# Patient Record
Sex: Female | Born: 1950 | Race: White | Hispanic: No | Marital: Married | Smoking: Never smoker
Health system: Southern US, Community
[De-identification: ages and names within clinical notes are randomized; demographics above are authoritative.]

## PROBLEM LIST (undated history)

## (undated) DIAGNOSIS — E1140 Type 2 diabetes mellitus with diabetic neuropathy, unspecified: Secondary | ICD-10-CM

## (undated) DIAGNOSIS — K259 Gastric ulcer, unspecified as acute or chronic, without hemorrhage or perforation: Secondary | ICD-10-CM

## (undated) DIAGNOSIS — E7800 Pure hypercholesterolemia, unspecified: Secondary | ICD-10-CM

## (undated) DIAGNOSIS — E119 Type 2 diabetes mellitus without complications: Secondary | ICD-10-CM

## (undated) DIAGNOSIS — N189 Chronic kidney disease, unspecified: Secondary | ICD-10-CM

## (undated) DIAGNOSIS — M797 Fibromyalgia: Secondary | ICD-10-CM

## (undated) DIAGNOSIS — H4020X0 Unspecified primary angle-closure glaucoma, stage unspecified: Secondary | ICD-10-CM

## (undated) DIAGNOSIS — I10 Essential (primary) hypertension: Secondary | ICD-10-CM

## (undated) DIAGNOSIS — K219 Gastro-esophageal reflux disease without esophagitis: Secondary | ICD-10-CM

## (undated) HISTORY — DX: Gastro-esophageal reflux disease without esophagitis: K21.9

## (undated) HISTORY — PX: CHOLECYSTECTOMY: SHX55

## (undated) HISTORY — PX: ESOPHAGOGASTRODUODENOSCOPY: SHX1529

## (undated) HISTORY — PX: COLONOSCOPY: SHX174

## (undated) HISTORY — PX: APPENDECTOMY: SHX54

## (undated) HISTORY — DX: Gastric ulcer, unspecified as acute or chronic, without hemorrhage or perforation: K25.9

## (undated) HISTORY — PX: REFRACTIVE SURGERY: SHX103

## (undated) HISTORY — DX: Type 2 diabetes mellitus with diabetic neuropathy, unspecified: E11.40

---

## 2013-05-09 ENCOUNTER — Encounter (HOSPITAL_COMMUNITY): Payer: Self-pay | Admitting: Pharmacy Technician

## 2013-05-16 NOTE — Patient Instructions (Signed)
Meredith Leblanc  05/16/2013   Your procedure is scheduled on:  Monday,April 27  Report to [facility] at 0730 AM.  Call this number if you have problems the morning of surgery: [phone]   Remember:   Do not eat food or drink liquids after midnight.   Take these medicines the morning of surgery with A SIP OF WATER: Lisinopril   Do not wear jewelry, make-up or nail polish.  Do not wear lotions, powders, or perfumes. You may wear deodorant.  Do not shave 48 hours prior to surgery. Men may shave face and neck.  Do not bring valuables to the hospital.  [facility] is not responsible  for any belongings or valuables.               Contacts, dentures or bridgework may not be worn into surgery.  Leave suitcase in the car. After surgery it may be brought to your room.  For patients admitted to the hospital, discharge time is determined by your treatment team.               Patients discharged the day of surgery will not be allowed to drive  home.  Name and phone number of your driver: Family or friend  Special Instructions: N/A   Please read over the following fact sheets that you were given: Pain Booklet, Coughing and Deep Breathing, MRSA Information, Surgical Site Infection Prevention, Anesthesia Post-op Instructions and Care and Recovery After Surgery  PATIENT INSTRUCTIONS POST-ANESTHESIA  IMMEDIATELY FOLLOWING SURGERY:  Do not drive or operate machinery for the first twenty four hours after surgery.  Do not make any important decisions for twenty four hours after surgery or while taking narcotic pain medications or sedatives.  If you develop intractable nausea and vomiting or a severe headache please notify your doctor immediately.  FOLLOW-UP:  Please make an appointment with your surgeon as instructed. You do not need to follow up with anesthesia unless specifically instructed to do so.  WOUND CARE INSTRUCTIONS (if applicable):  Keep a dry clean dressing on the anesthesia/puncture  wound site if there is drainage.  Once the wound has quit draining you may leave it open to air.  Generally you should leave the bandage intact for twenty four hours unless there is drainage.  If the epidural site drains for more than 36-48 hours please call the anesthesia department.  QUESTIONS?:  Please feel free to call your physician or the hospital operator if you have any questions, and they will be happy to assist you.     Cataract Surgery  A cataract is a clouding of the lens of the eye. When a lens becomes cloudy, vision is reduced based on the degree and nature of the clouding. Surgery may be needed to improve vision. Surgery removes the cloudy lens and usually replaces it with a substitute lens (intraocular lens, IOL). LET YOUR EYE DOCTOR KNOW ABOUT:  Allergies to food or medicine.  Medicines taken including herbs, eyedrops, over-the-counter medicines, and creams.  Use of steroids (by mouth or creams).  Previous problems with anesthetics or numbing medicine.  History of bleeding problems or blood clots.  Previous surgery.  Other health problems, including diabetes and kidney problems.  Possibility of pregnancy, if this applies. RISKS AND COMPLICATIONS  Infection.  Inflammation of the eyeball (endophthalmitis) that can spread to both eyes (sympathetic ophthalmia).  Poor wound healing.  If an IOL is inserted, it can later fall out of proper position. This is very uncommon.  Clouding of the part of your eye that holds an IOL in place. This is called an "after-cataract." These are uncommon, but easily treated. BEFORE THE PROCEDURE  Do not eat or drink anything except small amounts of water for 8 to 12 before your surgery, or as directed by your caregiver.  Unless you are told otherwise, continue any eyedrops you have been prescribed.  Talk to your primary caregiver about all other medicines that you take (both prescription and non-prescription). In some cases, you may  need to stop or change medicines near the time of your surgery. This is most important if you are taking blood-thinning medicine.Do not stop medicines unless you are told to do so.  Arrange for someone to drive you to and from the procedure.  Do not put contact lenses in either eye on the day of your surgery. PROCEDURE There is more than one method for safely removing a cataract. Your doctor can explain the differences and help determine which is best for you. Phacoemulsification surgery is the most common form of cataract surgery.  An injection is given behind the eye or eyedrops are given to make this a painless procedure.  A small cut (incision) is made on the edge of the clear, dome-shaped surface that covers the front of the eye (cornea).  A tiny probe is painlessly inserted into the eye. This device gives off ultrasound waves that soften and break up the cloudy center of the lens. This makes it easier for the cloudy lens to be removed by suction.  An IOL may be implanted.  The normal lens of the eye is covered by a clear capsule. Part of that capsule is intentionally left in the eye to support the IOL.  Your surgeon may or may not use stitches to close the incision. There are other forms of cataract surgery that require a larger incision and stiches to close the eye. This approach is taken in cases where the doctor feels that the cataract cannot be easily removed using phacoemulsification. AFTER THE PROCEDURE  When an IOL is implanted, it does not need care. It becomes a permanent part of your eye and cannot be seen or felt.  Your doctor will schedule follow-up exams to check on your progress.  Review your other medicines with your doctor to see which can be resumed after surgery.  Use eyedrops or take medicine as prescribed by your doctor.

## 2013-05-19 ENCOUNTER — Encounter (HOSPITAL_COMMUNITY)
Admission: RE | Admit: 2013-05-19 | Discharge: 2013-05-19 | Disposition: A | Discharge status: Home / Self Care | Payer: PRIVATE HEALTH INSURANCE | Source: Ambulatory Visit | Attending: Ophthalmology | Admitting: Ophthalmology

## 2013-05-19 ENCOUNTER — Encounter (HOSPITAL_COMMUNITY): Payer: Self-pay

## 2013-05-19 DIAGNOSIS — Z01810 Encounter for preprocedural cardiovascular examination: Secondary | ICD-10-CM | POA: Insufficient documentation

## 2013-05-19 DIAGNOSIS — Z01812 Encounter for preprocedural laboratory examination: Secondary | ICD-10-CM | POA: Insufficient documentation

## 2013-05-19 HISTORY — DX: Fibromyalgia: M79.7

## 2013-05-19 HISTORY — DX: Type 2 diabetes mellitus without complications: E11.9

## 2013-05-19 HISTORY — DX: Unspecified primary angle-closure glaucoma, stage unspecified: H40.20X0

## 2013-05-19 HISTORY — DX: Chronic kidney disease, unspecified: N18.9

## 2013-05-19 HISTORY — DX: Pure hypercholesterolemia, unspecified: E78.00

## 2013-05-19 LAB — HEMOGLOBIN AND HEMATOCRIT, BLOOD
HEMATOCRIT: 37 % (ref 36.0–46.0)
Hemoglobin: 12.5 g/dL (ref 12.0–15.0)

## 2013-05-19 LAB — BASIC METABOLIC PANEL
BUN: 13 mg/dL (ref 6–23)
CO2: 27 mEq/L (ref 19–32)
Calcium: 10.3 mg/dL (ref 8.4–10.5)
Chloride: 102 mEq/L (ref 96–112)
Creatinine, Ser: 0.58 mg/dL (ref 0.50–1.10)
GFR calc Af Amer: >90 mL/min (ref >90)
GLUCOSE: 104 mg/dL — AB (ref 70–99)
Potassium: 4.4 mEq/L (ref 3.7–5.3)
SODIUM: 142 meq/L (ref 137–147)

## 2013-05-19 NOTE — Pre-Procedure Instructions (Signed)
Patient given information to sign up for my chart at home. 

## 2013-05-23 MED ORDER — CYCLOPENTOLATE-PHENYLEPHRINE OP SOLN OPTIME - NO CHARGE
OPHTHALMIC | Status: AC
  Filled 2013-05-23: qty 2

## 2013-05-23 MED ORDER — TETRACAINE HCL 0.5 % OP SOLN
OPHTHALMIC | Status: AC
  Filled 2013-05-23: qty 2

## 2013-05-23 MED ORDER — NEOMYCIN-POLYMYXIN-DEXAMETH 3.5-10000-0.1 OP SUSP
OPHTHALMIC | Status: AC
  Filled 2013-05-23: qty 5

## 2013-05-23 MED ORDER — PHENYLEPHRINE HCL 2.5 % OP SOLN
OPHTHALMIC | Status: AC
  Filled 2013-05-23: qty 15

## 2013-05-23 MED ORDER — LIDOCAINE HCL 3.5 % OP GEL
OPHTHALMIC | Status: AC
  Filled 2013-05-23: qty 1

## 2013-05-23 MED ORDER — LIDOCAINE HCL (PF) 1 % IJ SOLN
INTRAMUSCULAR | Status: AC
  Filled 2013-05-23: qty 2

## 2013-05-26 ENCOUNTER — Encounter (HOSPITAL_COMMUNITY): Payer: PRIVATE HEALTH INSURANCE | Admitting: Anesthesiology

## 2013-05-26 ENCOUNTER — Ambulatory Visit (HOSPITAL_COMMUNITY): Payer: PRIVATE HEALTH INSURANCE | Admitting: Anesthesiology

## 2013-05-26 ENCOUNTER — Encounter (HOSPITAL_COMMUNITY): Payer: Self-pay

## 2013-05-26 ENCOUNTER — Encounter (HOSPITAL_COMMUNITY)
Admission: RE | Disposition: A | Discharge status: Home / Self Care | Payer: Self-pay | Source: Ambulatory Visit | Attending: Ophthalmology

## 2013-05-26 ENCOUNTER — Ambulatory Visit (HOSPITAL_COMMUNITY)
Admission: RE | Admit: 2013-05-26 | Discharge: 2013-05-26 | Disposition: A | Discharge status: Home / Self Care | Payer: PRIVATE HEALTH INSURANCE | Source: Ambulatory Visit | Attending: Ophthalmology | Admitting: Ophthalmology

## 2013-05-26 DIAGNOSIS — H2589 Other age-related cataract: Secondary | ICD-10-CM | POA: Insufficient documentation

## 2013-05-26 HISTORY — PX: CATARACT EXTRACTION W/PHACO: SHX586

## 2013-05-26 LAB — GLUCOSE, CAPILLARY: GLUCOSE-CAPILLARY: 138 mg/dL — AB (ref 70–99)

## 2013-05-26 SURGERY — PHACOEMULSIFICATION, CATARACT, WITH IOL INSERTION
Anesthesia: Monitor Anesthesia Care | Site: Eye | Laterality: Right

## 2013-05-26 MED ORDER — FENTANYL CITRATE 0.05 MG/ML IJ SOLN: 25.0000 ug | INTRAMUSCULAR | Status: DC | PRN

## 2013-05-26 MED ORDER — EPINEPHRINE HCL 1 MG/ML IJ SOLN
INTRAOCULAR | Status: DC | PRN
  Administered 2013-05-26: 09:00:00

## 2013-05-26 MED ORDER — TETRACAINE HCL 0.5 % OP SOLN
1.0000 [drp] | OPHTHALMIC | Status: AC
  Administered 2013-05-26 (×3): 1 [drp] via OPHTHALMIC

## 2013-05-26 MED ORDER — FENTANYL CITRATE 0.05 MG/ML IJ SOLN
INTRAMUSCULAR | Status: AC
  Filled 2013-05-26: qty 2

## 2013-05-26 MED ORDER — BSS IO SOLN
INTRAOCULAR | Status: DC | PRN
  Administered 2013-05-26: 15 mL via INTRAOCULAR

## 2013-05-26 MED ORDER — POVIDONE-IODINE 5 % OP SOLN
OPHTHALMIC | Status: DC | PRN
  Administered 2013-05-26: 1 via OPHTHALMIC

## 2013-05-26 MED ORDER — NA HYALUR & NA CHOND-NA HYALUR 0.55-0.5 ML IO KIT
PACK | INTRAOCULAR | Status: DC | PRN
  Administered 2013-05-26: 1 via OPHTHALMIC

## 2013-05-26 MED ORDER — CYCLOPENTOLATE-PHENYLEPHRINE 0.2-1 % OP SOLN
1.0000 [drp] | OPHTHALMIC | Status: AC
  Administered 2013-05-26 (×3): 1 [drp] via OPHTHALMIC

## 2013-05-26 MED ORDER — MIDAZOLAM HCL 2 MG/2ML IJ SOLN
1.0000 mg | INTRAMUSCULAR | Status: DC | PRN
  Administered 2013-05-26: 2 mg via INTRAVENOUS

## 2013-05-26 MED ORDER — PHENYLEPHRINE HCL 2.5 % OP SOLN
1.0000 [drp] | OPHTHALMIC | Status: AC
  Administered 2013-05-26 (×3): 1 [drp] via OPHTHALMIC

## 2013-05-26 MED ORDER — NEOMYCIN-POLYMYXIN-DEXAMETH 3.5-10000-0.1 OP SUSP
OPHTHALMIC | Status: DC | PRN
  Administered 2013-05-26: 2 [drp] via OPHTHALMIC

## 2013-05-26 MED ORDER — FENTANYL CITRATE 0.05 MG/ML IJ SOLN
25.0000 ug | INTRAMUSCULAR | Status: AC
  Administered 2013-05-26 (×2): 25 ug via INTRAVENOUS

## 2013-05-26 MED ORDER — LIDOCAINE HCL (PF) 1 % IJ SOLN
INTRAMUSCULAR | Status: DC | PRN
  Administered 2013-05-26: .6 mL

## 2013-05-26 MED ORDER — MIDAZOLAM HCL 2 MG/2ML IJ SOLN
INTRAMUSCULAR | Status: AC
  Filled 2013-05-26: qty 2

## 2013-05-26 MED ORDER — LIDOCAINE 3.5 % OP GEL OPTIME - NO CHARGE
OPHTHALMIC | Status: DC | PRN
  Administered 2013-05-26: 2 [drp] via OPHTHALMIC

## 2013-05-26 MED ORDER — LACTATED RINGERS IV SOLN
INTRAVENOUS | Status: DC
  Administered 2013-05-26: 09:00:00 via INTRAVENOUS

## 2013-05-26 MED ORDER — ONDANSETRON HCL 4 MG/2ML IJ SOLN: 4.0000 mg | Freq: Once | INTRAMUSCULAR | Status: DC | PRN

## 2013-05-26 MED ORDER — LIDOCAINE HCL 3.5 % OP GEL: 1.0000 "application " | Freq: Once | OPHTHALMIC | Status: DC

## 2013-05-26 SURGICAL SUPPLY — 34 items
CAPSULAR TENSION RING-AMO (OPHTHALMIC RELATED) IMPLANT
CLOTH BEACON ORANGE TIMEOUT ST (SAFETY) ×3 IMPLANT
EYE SHIELD UNIVERSAL CLEAR (GAUZE/BANDAGES/DRESSINGS) ×3 IMPLANT
GLOVE BIO SURGEON STRL SZ 6.5 (GLOVE) IMPLANT
GLOVE BIO SURGEONS STRL SZ 6.5 (GLOVE)
GLOVE BIOGEL PI IND STRL 6.5 (GLOVE) IMPLANT
GLOVE BIOGEL PI IND STRL 7.0 (GLOVE) ×1 IMPLANT
GLOVE BIOGEL PI IND STRL 7.5 (GLOVE) IMPLANT
GLOVE BIOGEL PI INDICATOR 6.5 (GLOVE)
GLOVE BIOGEL PI INDICATOR 7.0 (GLOVE) ×2
GLOVE BIOGEL PI INDICATOR 7.5 (GLOVE)
GLOVE ECLIPSE 6.5 STRL STRAW (GLOVE) IMPLANT
GLOVE ECLIPSE 7.0 STRL STRAW (GLOVE) IMPLANT
GLOVE ECLIPSE 7.5 STRL STRAW (GLOVE) IMPLANT
GLOVE EXAM NITRILE LRG STRL (GLOVE) IMPLANT
GLOVE EXAM NITRILE MD LF STRL (GLOVE) IMPLANT
GLOVE SKINSENSE NS SZ6.5 (GLOVE)
GLOVE SKINSENSE NS SZ7.0 (GLOVE)
GLOVE SKINSENSE STRL SZ6.5 (GLOVE) IMPLANT
GLOVE SKINSENSE STRL SZ7.0 (GLOVE) IMPLANT
GLOVE SS N UNI LF 7.0 STRL (GLOVE) ×3 IMPLANT
KIT VITRECTOMY (OPHTHALMIC RELATED) IMPLANT
PAD ARMBOARD 7.5X6 YLW CONV (MISCELLANEOUS) ×3 IMPLANT
PROC W NO LENS (INTRAOCULAR LENS)
PROC W SPEC LENS (INTRAOCULAR LENS)
PROCESS W NO LENS (INTRAOCULAR LENS) IMPLANT
PROCESS W SPEC LENS (INTRAOCULAR LENS) IMPLANT
RING MALYGIN (MISCELLANEOUS) IMPLANT
SIGHTPATH CAT PROC W REG LENS (Ophthalmic Related) ×3 IMPLANT
SYR TB 1ML LL NO SAFETY (SYRINGE) ×3 IMPLANT
TAPE SURG TRANSPORE 1 IN (GAUZE/BANDAGES/DRESSINGS) ×1 IMPLANT
TAPE SURGICAL TRANSPORE 1 IN (GAUZE/BANDAGES/DRESSINGS) ×2
VISCOELASTIC ADDITIONAL (OPHTHALMIC RELATED) IMPLANT
WATER STERILE IRR 250ML POUR (IV SOLUTION) ×3 IMPLANT

## 2013-05-26 NOTE — Discharge Instructions (Signed)

## 2013-05-26 NOTE — Anesthesia Preprocedure Evaluation (Addendum)
Anesthesia Evaluation  Patient identified by MRN, date of birth, ID band Patient awake    Reviewed: Allergy & Precautions, H&P , NPO status , Patient's Chart, lab work & pertinent test results  Airway Mallampati: III TM Distance: >3 FB     Dental  (+) Teeth Intact   Pulmonary  breath sounds clear to auscultation        Cardiovascular negative cardio ROS  Rhythm:Regular     Neuro/Psych    GI/Hepatic   Endo/Other  diabetes, Type 2, Oral Hypoglycemic Agents  Renal/GU Renal InsufficiencyRenal disease     Musculoskeletal  (+) Fibromyalgia -  Abdominal   Peds  Hematology   Anesthesia Other Findings   Reproductive/Obstetrics                          Anesthesia Physical Anesthesia Plan  ASA: III  Anesthesia Plan: MAC   Post-op Pain Management:    Induction: Intravenous  Airway Management Planned: Nasal Cannula  Additional Equipment:   Intra-op Plan:   Post-operative Plan:   Informed Consent: I have reviewed the patients History and Physical, chart, labs and discussed the procedure including the risks, benefits and alternatives for the proposed anesthesia with the patient or authorized representative who has indicated his/her understanding and acceptance.     Plan Discussed with:   Anesthesia Plan Comments:         Anesthesia Quick Evaluation

## 2013-05-26 NOTE — Anesthesia Postprocedure Evaluation (Signed)
  Anesthesia Post-op Note  Patient: Meredith Leblanc  Procedure(s) Performed: Procedure(s) (LRB): CATARACT EXTRACTION PHACO AND INTRAOCULAR LENS PLACEMENT (IOC) (Right)  Patient Location:  Short Stay  Anesthesia Type: MAC  Level of Consciousness: awake  Airway and Oxygen Therapy: Patient Spontanous Breathing  Post-op Pain: none  Post-op Assessment: Post-op Vital signs reviewed, Patient's Cardiovascular Status Stable, Respiratory Function Stable, Patent Airway, No signs of Nausea or vomiting and Pain level controlled  Post-op Vital Signs: Reviewed and stable  Complications: No apparent anesthesia complications

## 2013-05-26 NOTE — Transfer of Care (Signed)
Immediate Anesthesia Transfer of Care Note  Patient: Meredith Leblanc  Procedure(s) Performed: Procedure(s) (LRB): CATARACT EXTRACTION PHACO AND INTRAOCULAR LENS PLACEMENT (IOC) (Right)  Patient Location: Shortstay  Anesthesia Type: MAC  Level of Consciousness: awake  Airway & Oxygen Therapy: Patient Spontanous Breathing   Post-op Assessment: Report given to PACU RN, Post -op Vital signs reviewed and stable and Patient moving all extremities  Post vital signs: Reviewed and stable  Complications: No apparent anesthesia complications

## 2013-05-26 NOTE — Anesthesia Procedure Notes (Signed)
Procedure Name: MAC Date/Time: 05/26/2013 8:55 AM Performed by: Vista Deck Pre-anesthesia Checklist: Patient identified, Emergency Drugs available, Suction available, Timeout performed and Patient being monitored Patient Re-evaluated:Patient Re-evaluated prior to inductionOxygen Delivery Method: Nasal Cannula

## 2013-05-26 NOTE — H&P (Signed)
I have reviewed the H&P, the patient was re-examined, and I have identified no interval changes in medical condition and plan of care since the history and physical of record  

## 2013-05-26 NOTE — Op Note (Signed)
Date of Admission: 05/26/2013  Date of Surgery: 05/26/2013   Pre-Op Dx: Cataract Right Eye  Post-Op Dx: Combined Cataract Right  Eye,  Dx Code 366.19  Surgeon: Tonny Branch, M.D.  Assistants: None  Anesthesia: Topical with MAC  Indications: Painless, progressive loss of vision with compromise of daily activities.  Surgery: Cataract Extraction with Intraocular lens Implant Right Eye  Discription: The patient had dilating drops and viscous lidocaine placed into the Right eye in the pre-op holding area. After transfer to the operating room, a time out was performed. The patient was then prepped and draped. Beginning with a 48 degree blade a paracentesis port was made at the surgeon's 2 o'clock position. The anterior chamber was then filled with 1% non-preserved lidocaine. This was followed by filling the anterior chamber with Provisc.  A 2.40mm keratome blade was used to make a clear corneal incision at the temporal limbus.  A bent cystatome needle was used to create a continuous tear capsulotomy. Hydrodissection was performed with balanced salt solution on a Fine canula. The lens nucleus was then removed using the phacoemulsification handpiece. Residual cortex was removed with the I&A handpiece. The anterior chamber and capsular bag were refilled with Provisc. A posterior chamber intraocular lens was placed into the capsular bag with it's injector. The implant was positioned with the Kuglan hook. The Provisc was then removed from the anterior chamber and capsular bag with the I&A handpiece. Stromal hydration of the main incision and paracentesis port was performed with BSS on a Fine canula. The wounds were tested for leak which was negative. The patient tolerated the procedure well. There were no operative complications. The patient was then transferred to the recovery room in stable condition.  Complications: None  Specimen: None  EBL: None  Prosthetic device: Hoya iSert 250, power 21.0 D, SN  [id number].

## 2013-05-27 ENCOUNTER — Encounter (HOSPITAL_COMMUNITY): Payer: Self-pay | Admitting: Ophthalmology

## 2013-06-02 ENCOUNTER — Encounter (HOSPITAL_COMMUNITY): Payer: Self-pay | Admitting: Pharmacy Technician

## 2013-06-03 ENCOUNTER — Encounter (HOSPITAL_COMMUNITY): Payer: Self-pay

## 2013-06-03 ENCOUNTER — Encounter (HOSPITAL_COMMUNITY)
Admission: RE | Admit: 2013-06-03 | Discharge: 2013-06-03 | Disposition: A | Discharge status: Home / Self Care | Payer: PRIVATE HEALTH INSURANCE | Source: Ambulatory Visit | Attending: Ophthalmology | Admitting: Ophthalmology

## 2013-06-03 HISTORY — DX: Essential (primary) hypertension: I10

## 2013-06-05 ENCOUNTER — Ambulatory Visit (HOSPITAL_COMMUNITY): Payer: PRIVATE HEALTH INSURANCE | Admitting: Anesthesiology

## 2013-06-05 ENCOUNTER — Encounter (HOSPITAL_COMMUNITY)
Admission: RE | Disposition: A | Discharge status: Home / Self Care | Payer: Self-pay | Source: Ambulatory Visit | Attending: Ophthalmology

## 2013-06-05 ENCOUNTER — Encounter (HOSPITAL_COMMUNITY): Payer: Self-pay | Admitting: *Deleted

## 2013-06-05 ENCOUNTER — Ambulatory Visit (HOSPITAL_COMMUNITY)
Admission: RE | Admit: 2013-06-05 | Discharge: 2013-06-05 | Disposition: A | Discharge status: Home / Self Care | Payer: PRIVATE HEALTH INSURANCE | Source: Ambulatory Visit | Attending: Ophthalmology | Admitting: Ophthalmology

## 2013-06-05 ENCOUNTER — Encounter (HOSPITAL_COMMUNITY): Payer: PRIVATE HEALTH INSURANCE | Admitting: Anesthesiology

## 2013-06-05 DIAGNOSIS — Z79899 Other long term (current) drug therapy: Secondary | ICD-10-CM | POA: Insufficient documentation

## 2013-06-05 DIAGNOSIS — E119 Type 2 diabetes mellitus without complications: Secondary | ICD-10-CM | POA: Insufficient documentation

## 2013-06-05 DIAGNOSIS — IMO0001 Reserved for inherently not codable concepts without codable children: Secondary | ICD-10-CM | POA: Insufficient documentation

## 2013-06-05 DIAGNOSIS — I10 Essential (primary) hypertension: Secondary | ICD-10-CM | POA: Insufficient documentation

## 2013-06-05 DIAGNOSIS — H269 Unspecified cataract: Secondary | ICD-10-CM | POA: Insufficient documentation

## 2013-06-05 DIAGNOSIS — N289 Disorder of kidney and ureter, unspecified: Secondary | ICD-10-CM | POA: Insufficient documentation

## 2013-06-05 HISTORY — PX: CATARACT EXTRACTION W/PHACO: SHX586

## 2013-06-05 LAB — GLUCOSE, CAPILLARY: Glucose-Capillary: 113 mg/dL — ABNORMAL HIGH (ref 70–99)

## 2013-06-05 SURGERY — PHACOEMULSIFICATION, CATARACT, WITH IOL INSERTION
Anesthesia: Monitor Anesthesia Care | Site: Eye | Laterality: Left

## 2013-06-05 MED ORDER — CYCLOPENTOLATE-PHENYLEPHRINE OP SOLN OPTIME - NO CHARGE
OPHTHALMIC | Status: AC
  Filled 2013-06-05: qty 2

## 2013-06-05 MED ORDER — FENTANYL CITRATE 0.05 MG/ML IJ SOLN
25.0000 ug | INTRAMUSCULAR | Status: AC
  Administered 2013-06-05 (×2): 25 ug via INTRAVENOUS

## 2013-06-05 MED ORDER — MIDAZOLAM HCL 2 MG/2ML IJ SOLN
1.0000 mg | INTRAMUSCULAR | Status: DC | PRN
  Administered 2013-06-05: 2 mg via INTRAVENOUS

## 2013-06-05 MED ORDER — TETRACAINE HCL 0.5 % OP SOLN
OPHTHALMIC | Status: AC
  Filled 2013-06-05: qty 2

## 2013-06-05 MED ORDER — NEOMYCIN-POLYMYXIN-DEXAMETH 3.5-10000-0.1 OP SUSP
OPHTHALMIC | Status: AC
  Filled 2013-06-05: qty 5

## 2013-06-05 MED ORDER — TETRACAINE HCL 0.5 % OP SOLN
1.0000 [drp] | OPHTHALMIC | Status: AC
  Administered 2013-06-05 (×3): 1 [drp] via OPHTHALMIC

## 2013-06-05 MED ORDER — FENTANYL CITRATE 0.05 MG/ML IJ SOLN
INTRAMUSCULAR | Status: AC
  Filled 2013-06-05: qty 2

## 2013-06-05 MED ORDER — LACTATED RINGERS IV SOLN
INTRAVENOUS | Status: DC
  Administered 2013-06-05: 12:00:00 via INTRAVENOUS

## 2013-06-05 MED ORDER — MIDAZOLAM HCL 2 MG/2ML IJ SOLN
INTRAMUSCULAR | Status: AC
  Filled 2013-06-05: qty 2

## 2013-06-05 MED ORDER — NEOMYCIN-POLYMYXIN-DEXAMETH 3.5-10000-0.1 OP SUSP
OPHTHALMIC | Status: DC | PRN
  Administered 2013-06-05: 2 [drp] via OPHTHALMIC

## 2013-06-05 MED ORDER — PHENYLEPHRINE HCL 2.5 % OP SOLN
1.0000 [drp] | OPHTHALMIC | Status: AC
  Administered 2013-06-05 (×3): 1 [drp] via OPHTHALMIC

## 2013-06-05 MED ORDER — BSS IO SOLN
INTRAOCULAR | Status: DC | PRN
  Administered 2013-06-05: 15 mL via INTRAOCULAR

## 2013-06-05 MED ORDER — EPINEPHRINE HCL 1 MG/ML IJ SOLN
INTRAMUSCULAR | Status: AC
  Filled 2013-06-05: qty 1

## 2013-06-05 MED ORDER — PROVISC 10 MG/ML IO SOLN
INTRAOCULAR | Status: DC | PRN
  Administered 2013-06-05: 0.85 mL via INTRAOCULAR

## 2013-06-05 MED ORDER — MIDAZOLAM HCL 5 MG/5ML IJ SOLN
INTRAMUSCULAR | Status: DC | PRN
  Administered 2013-06-05: 2 mg via INTRAVENOUS

## 2013-06-05 MED ORDER — PHENYLEPHRINE HCL 2.5 % OP SOLN
OPHTHALMIC | Status: AC
Start: 2013-06-05 — End: 2013-06-05
  Filled 2013-06-05: qty 15

## 2013-06-05 MED ORDER — LIDOCAINE HCL 3.5 % OP GEL
OPHTHALMIC | Status: AC
  Filled 2013-06-05: qty 1

## 2013-06-05 MED ORDER — EPINEPHRINE HCL 1 MG/ML IJ SOLN
INTRAOCULAR | Status: DC | PRN
  Administered 2013-06-05: 13:00:00

## 2013-06-05 MED ORDER — LIDOCAINE HCL (PF) 1 % IJ SOLN
INTRAMUSCULAR | Status: AC
  Filled 2013-06-05: qty 2

## 2013-06-05 MED ORDER — CYCLOPENTOLATE-PHENYLEPHRINE 0.2-1 % OP SOLN
1.0000 [drp] | OPHTHALMIC | Status: AC
  Administered 2013-06-05 (×3): 1 [drp] via OPHTHALMIC

## 2013-06-05 MED ORDER — LIDOCAINE HCL 3.5 % OP GEL
1.0000 "application " | Freq: Once | OPHTHALMIC | Status: AC
  Administered 2013-06-05: 1 via OPHTHALMIC

## 2013-06-05 MED ORDER — POVIDONE-IODINE 5 % OP SOLN
OPHTHALMIC | Status: DC | PRN
  Administered 2013-06-05: 1 via OPHTHALMIC

## 2013-06-05 MED ORDER — LIDOCAINE HCL (PF) 1 % IJ SOLN
INTRAMUSCULAR | Status: DC | PRN
  Administered 2013-06-05: .4 mL

## 2013-06-05 SURGICAL SUPPLY — 33 items
CAPSULAR TENSION RING-AMO (OPHTHALMIC RELATED) IMPLANT
CLOTH BEACON ORANGE TIMEOUT ST (SAFETY) ×3 IMPLANT
EYE SHIELD UNIVERSAL CLEAR (GAUZE/BANDAGES/DRESSINGS) ×3 IMPLANT
GLOVE BIO SURGEON STRL SZ 6.5 (GLOVE) IMPLANT
GLOVE BIO SURGEONS STRL SZ 6.5 (GLOVE)
GLOVE BIOGEL PI IND STRL 6.5 (GLOVE) IMPLANT
GLOVE BIOGEL PI IND STRL 7.0 (GLOVE) ×2 IMPLANT
GLOVE BIOGEL PI IND STRL 7.5 (GLOVE) IMPLANT
GLOVE BIOGEL PI INDICATOR 6.5 (GLOVE)
GLOVE BIOGEL PI INDICATOR 7.0 (GLOVE) ×4
GLOVE BIOGEL PI INDICATOR 7.5 (GLOVE)
GLOVE ECLIPSE 6.5 STRL STRAW (GLOVE) IMPLANT
GLOVE ECLIPSE 7.0 STRL STRAW (GLOVE) IMPLANT
GLOVE ECLIPSE 7.5 STRL STRAW (GLOVE) IMPLANT
GLOVE EXAM NITRILE LRG STRL (GLOVE) IMPLANT
GLOVE EXAM NITRILE MD LF STRL (GLOVE) IMPLANT
GLOVE SKINSENSE NS SZ6.5 (GLOVE)
GLOVE SKINSENSE NS SZ7.0 (GLOVE)
GLOVE SKINSENSE STRL SZ6.5 (GLOVE) IMPLANT
GLOVE SKINSENSE STRL SZ7.0 (GLOVE) IMPLANT
KIT VITRECTOMY (OPHTHALMIC RELATED) IMPLANT
PAD ARMBOARD 7.5X6 YLW CONV (MISCELLANEOUS) ×3 IMPLANT
PROC W NO LENS (INTRAOCULAR LENS)
PROC W SPEC LENS (INTRAOCULAR LENS)
PROCESS W NO LENS (INTRAOCULAR LENS) IMPLANT
PROCESS W SPEC LENS (INTRAOCULAR LENS) IMPLANT
RING MALYGIN (MISCELLANEOUS) IMPLANT
SIGHTPATH CAT PROC W REG LENS (Ophthalmic Related) ×3 IMPLANT
SYR TB 1ML LL NO SAFETY (SYRINGE) ×3 IMPLANT
TAPE SURG TRANSPORE 1 IN (GAUZE/BANDAGES/DRESSINGS) ×1 IMPLANT
TAPE SURGICAL TRANSPORE 1 IN (GAUZE/BANDAGES/DRESSINGS) ×2
VISCOELASTIC ADDITIONAL (OPHTHALMIC RELATED) IMPLANT
WATER STERILE IRR 250ML POUR (IV SOLUTION) ×3 IMPLANT

## 2013-06-05 NOTE — Discharge Instructions (Signed)

## 2013-06-05 NOTE — Transfer of Care (Signed)
Immediate Anesthesia Transfer of Care Note  Patient: Meredith Leblanc  Procedure(s) Performed: Procedure(s) with comments: CATARACT EXTRACTION PHACO AND INTRAOCULAR LENS PLACEMENT (IOC) (Left) - CDE 11.21  Patient Location: Short Stay  Anesthesia Type:MAC  Level of Consciousness: awake  Airway & Oxygen Therapy: Patient Spontanous Breathing  Post-op Assessment: Report given to PACU RN  Post vital signs: Reviewed  Complications: No apparent anesthesia complications

## 2013-06-05 NOTE — Op Note (Signed)
Date of Admission: 06/05/2013  Date of Surgery: 06/05/2013   Pre-Op Dx: Cataract Left Eye  Post-Op Dx: Cataract Left  Eye,  Dx Code 366.19  Surgeon: Tonny Branch, M.D.  Assistants: None  Anesthesia: Topical with MAC  Indications: Painless, progressive loss of vision with compromise of daily activities.  Surgery: Cataract Extraction with Intraocular lens Implant Left Eye  Discription: The patient had dilating drops and viscous lidocaine placed into the Left eye in the pre-op holding area. After transfer to the operating room, a time out was performed. The patient was then prepped and draped. Beginning with a 62 degree blade a paracentesis port was made at the surgeon's 2 o'clock position. The anterior chamber was then filled with 1% non-preserved lidocaine. This was followed by filling the anterior chamber with Provisc.  A 2.21mm keratome blade was used to make a clear corneal incision at the temporal limbus.  A bent cystatome needle was used to create a continuous tear capsulotomy. Hydrodissection was performed with balanced salt solution on a Fine canula. The lens nucleus was then removed using the phacoemulsification handpiece. Residual cortex was removed with the I&A handpiece. The anterior chamber and capsular bag were refilled with Provisc. A posterior chamber intraocular lens was placed into the capsular bag with it's injector. The implant was positioned with the Kuglan hook. The Provisc was then removed from the anterior chamber and capsular bag with the I&A handpiece. Stromal hydration of the main incision and paracentesis port was performed with BSS on a Fine canula. The wounds were tested for leak which was negative. The patient tolerated the procedure well. There were no operative complications. The patient was then transferred to the recovery room in stable condition.  Complications: None  Specimen: None  EBL: None  Prosthetic device: Hoya iSert 250, power 20.5 D, SN [id number].

## 2013-06-05 NOTE — H&P (Signed)
I have reviewed the H&P, the patient was re-examined, and I have identified no interval changes in medical condition and plan of care since the history and physical of record  

## 2013-06-05 NOTE — Anesthesia Preprocedure Evaluation (Signed)
Anesthesia Evaluation  Patient identified by MRN, date of birth, ID band Patient awake    Reviewed: Allergy & Precautions, H&P , NPO status , Patient's Chart, lab work & pertinent test results  Airway Mallampati: III TM Distance: >3 FB     Dental  (+) Teeth Intact   Pulmonary  breath sounds clear to auscultation        Cardiovascular hypertension, negative cardio ROS  Rhythm:Regular     Neuro/Psych    GI/Hepatic   Endo/Other  diabetes, Type 2, Oral Hypoglycemic Agents  Renal/GU Renal InsufficiencyRenal disease     Musculoskeletal  (+) Fibromyalgia -  Abdominal   Peds  Hematology   Anesthesia Other Findings   Reproductive/Obstetrics                           Anesthesia Physical Anesthesia Plan  ASA: III  Anesthesia Plan: MAC   Post-op Pain Management:    Induction: Intravenous  Airway Management Planned: Nasal Cannula  Additional Equipment:   Intra-op Plan:   Post-operative Plan:   Informed Consent: I have reviewed the patients History and Physical, chart, labs and discussed the procedure including the risks, benefits and alternatives for the proposed anesthesia with the patient or authorized representative who has indicated his/her understanding and acceptance.     Plan Discussed with:   Anesthesia Plan Comments:         Anesthesia Quick Evaluation

## 2013-06-05 NOTE — Anesthesia Postprocedure Evaluation (Signed)
  Anesthesia Post-op Note  Patient: Meredith Leblanc  Procedure(s) Performed: Procedure(s) with comments: CATARACT EXTRACTION PHACO AND INTRAOCULAR LENS PLACEMENT (IOC) (Left) - CDE 11.21  Patient Location: Short Stay  Anesthesia Type:MAC  Level of Consciousness: awake, alert  and oriented  Airway and Oxygen Therapy: Patient Spontanous Breathing  Post-op Pain: none  Post-op Assessment: Post-op Vital signs reviewed, Patient's Cardiovascular Status Stable, Respiratory Function Stable, Patent Airway and No signs of Nausea or vomiting  Post-op Vital Signs: Reviewed and stable  Last Vitals:  Filed Vitals:   06/05/13 1131  BP: 119/57  Pulse: 72  Temp: 68.0 C    Complications: No apparent anesthesia complications

## 2013-06-06 ENCOUNTER — Encounter (HOSPITAL_COMMUNITY): Payer: Self-pay | Admitting: Ophthalmology

## 2014-12-17 ENCOUNTER — Other Ambulatory Visit: Payer: Self-pay

## 2014-12-17 ENCOUNTER — Encounter: Payer: Self-pay | Admitting: Nurse Practitioner

## 2014-12-17 ENCOUNTER — Ambulatory Visit (INDEPENDENT_AMBULATORY_CARE_PROVIDER_SITE_OTHER): Payer: PRIVATE HEALTH INSURANCE | Admitting: Nurse Practitioner

## 2014-12-17 VITALS — BP 147/71 | HR 70 | Temp 98.2°F | Ht 63.5 in | Wt 176.0 lb

## 2014-12-17 DIAGNOSIS — R1310 Dysphagia, unspecified: Secondary | ICD-10-CM

## 2014-12-17 DIAGNOSIS — A09 Infectious gastroenteritis and colitis, unspecified: Secondary | ICD-10-CM

## 2014-12-17 DIAGNOSIS — K219 Gastro-esophageal reflux disease without esophagitis: Secondary | ICD-10-CM

## 2014-12-17 DIAGNOSIS — R197 Diarrhea, unspecified: Secondary | ICD-10-CM

## 2014-12-17 NOTE — Progress Notes (Signed)
Primary Care Physician:  Curlene Labrum, MD Primary Gastroenterologist:  Dr. Gala Romney  Chief Complaint  Patient presents with  . Diarrhea  . Nausea  . Emesis    HPI:   64 year old female presents on referral from PCP for multiple complaints. Last visit with her PCP on 12/11/2014, PCP notes and labs reviewed. At last visit with PCP patient was nauseous, having bloating and then diarrhea. Diarrhea is persistent 5 times a day with urgency. Has chronic diarrhea however symptoms have become significantly worse recently. History of gastric ulcers and H. pylori in the late 2000. Also with abdominal pain in the epigastric area described as aching, nausea and vomiting. Also complains of dysphagia. Labs are ordered CBC shows normal white blood cell count, normal H/H, normal lipase, amylase. H. pylori IgG negative however the patient appears to be on chronic Nexium and unsure of the accuracy of her H. pylori result. No results for stool studies included.  Today she states she is having worsening diarrhea. Also with epigastric pain and dysphagia. Started taking Nexium again this week. Dysphagia symptoms occur with every meal, feels like food sits at the epigastrum and will have nocturnal regurgitation at times. Also has N/V occasionally when bending forward. States CT abdomen was negative. Also with bloating. Symptoms worse when she eats a large meal. Having a bowel movement about 5-6 times a day and is loose, watery, non-bloody at times and at other times not watery and more just soft. Denies hematochezia and melena. Denies any other abdominal pain besides the epigastric pain. Denies chest pain, dyspnea, dizziness, lightheadedness, syncope, near syncope. Denies any other upper or lower GI symptoms.  Past Medical History  Diagnosis Date  . Hypercholesteremia   . Diabetes mellitus without complication ([place])   . Glaucoma, narrow-angle   . Chronic kidney disease     elevated bun and creat at times  .  Fibromyalgia   . Hypertension     Past Surgical History  Procedure Laterality Date  . Refractive surgery Bilateral     for narrow angle glaucoma  . Appendectomy    . Cholecystectomy    . Cataract extraction w/phaco Right 05/26/2013    Procedure: CATARACT EXTRACTION PHACO AND INTRAOCULAR LENS PLACEMENT (IOC);  Surgeon: Tonny Branch, MD;  Location: [facility];  Service: Ophthalmology;  Laterality: Right;  CDE 13.40  . Cataract extraction w/phaco Left 06/05/2013    Procedure: CATARACT EXTRACTION PHACO AND INTRAOCULAR LENS PLACEMENT (IOC);  Surgeon: Tonny Branch, MD;  Location: [facility];  Service: Ophthalmology;  Laterality: Left;  CDE 11.21    Current Outpatient Prescriptions  Medication Sig Dispense Refill  . calcium-vitamin D (OSCAL WITH D) 500-200 MG-UNIT per tablet Take 1 tablet by mouth 2 (two) times daily.    Marland Kitchen esomeprazole (NEXIUM) 40 MG capsule TAKE 1 CAPSULE BY MOUTH DAILY FOR REFLUX  3  . GLIPIZIDE ER PO Take 5 mg by mouth 2 (two) times daily.    . metFORMIN (GLUCOPHAGE) 1000 MG tablet Take 1,000 mg by mouth 2 (two) times daily with a meal.    . rosuvastatin (CRESTOR) 10 MG tablet Take 10 mg by mouth daily.    Marland Kitchen exenatide (BYETTA) 5 MCG/0.02ML SOPN injection Inject 5 mcg into the skin 2 (two) times daily with a meal.    . lisinopril (PRINIVIL,ZESTRIL) 5 MG tablet Take 5 mg by mouth daily.     No current facility-administered medications for this visit.    Allergies as of 12/17/2014  . (No Known Allergies)  No family history on file.  Social History   Social History  . Marital Status: Married    Spouse Name: N/A  . Number of Children: N/A  . Years of Education: N/A   Occupational History  . Not on file.   Social History Main Topics  . Smoking status: Never Smoker   . Smokeless tobacco: Not on file  . Alcohol Use: No  . Drug Use: No  . Sexual Activity: Yes    Birth Control/ Protection: Post-menopausal   Other Topics Concern  . Not on file   Social History Narrative      Review of Systems: General: Negative for anorexia, weight loss, fever, chills, fatigue, weakness. Eyes: Negative for vision changes.  ENT: Negative for hoarseness. CV: Negative for chest pain, angina, palpitations, peripheral edema.  Respiratory: Negative for dyspnea at rest, cough, sputum, wheezing.  GI: See history of present illness.  Derm: Negative for rash or itching.  Endo: Negative for unusual weight change.  Heme: Negative for bruising or bleeding. Allergy: Negative for rash or hives.    Physical Exam: BP 147/71 mmHg  Pulse 70  Temp(Src) 98.2 F (36.8 C) (Oral)  Ht 5' 3.5" (1.613 m)  Wt 176 lb (79.833 kg)  BMI 30.68 kg/m2 General:   Alert and oriented. Pleasant and cooperative. Well-nourished and well-developed.  Head:  Normocephalic and atraumatic. Eyes:  Without icterus, sclera clear and conjunctiva pink.  Ears:  Normal auditory acuity. Cardiovascular:  S1, S2 present without murmurs appreciated. Extremities without clubbing or edema. Respiratory:  Clear to auscultation bilaterally. No wheezes, rales, or rhonchi. No distress.  Gastrointestinal:  +BS, soft, and non-distended. Epigastric TTP. No HSM noted. No guarding or rebound. No masses appreciated.  Rectal:  Deferred  Neurologic:  Alert and oriented x4;  grossly normal neurologically. Psych:  Alert and cooperative. Normal mood and affect. Heme/Lymph/Immune: No excessive bruising noted.    12/17/2014 10:35 AM

## 2014-12-17 NOTE — Patient Instructions (Signed)
1. We will request the records of your stool studies from your primary care doctor. 2. We will request a full colonoscopy report from [facility]. 3. Continue taking Nexium. 4. We will schedule your endoscopy for you. 5. Further recommendations to be based on the results of your procedure. 6. Return for follow-up in 6-8 weeks.

## 2014-12-21 NOTE — Assessment & Plan Note (Signed)
Patient with continued GERD symptoms on PPI (Nexium). She does have a history of gastric ulcers and H. pylori. We will have her continue her Nexium. We'll plan for endoscopy with possible dilation as noted below. As an aside we will also request her TCS report from [facility] to determine when she is next due for screening exam.

## 2014-12-21 NOTE — Assessment & Plan Note (Addendum)
Asian has a history of chronic diarrhea, although she states it is become worse recently. She states stool study done by PCP was normal. We will request her stool study results from the PCP. Presuming negative, she can use antidiarrheals for now. Possible limited viral gastroenteritis. Return for follow-up in 6-8 weeks or sooner if needed. If symptoms persist can consider other antidiarrheal medications including consideration of IBS D as an etiology.

## 2014-12-21 NOTE — Progress Notes (Signed)
CC'D TO PCP °

## 2014-12-21 NOTE — Assessment & Plan Note (Signed)
Patient with dysphagia symptoms. Has a history of GERD, gastric ulcers, H. pylori. Possible reflux esophagitis and inflammation. Cannot rule out stricture ring or web. We'll proceed with endoscopy with possible dilation. Return for follow-up in 6-8 weeks.  Proceed with EGD +/- dilation with Dr. Gala Romney in near future: the risks, benefits, and alternatives have been discussed with the patient in detail. The patient states understanding and desires to proceed.  The patient is not on any anxiolytics, anticoagulants, chronic pain medications, or antidepressants. Denies alcohol and drug use. Conscious sedation should be adequate for her procedure.

## 2014-12-30 ENCOUNTER — Ambulatory Visit (HOSPITAL_COMMUNITY)
Admission: RE | Admit: 2014-12-30 | Discharge: 2014-12-30 | Disposition: A | Discharge status: Home Health | Payer: PRIVATE HEALTH INSURANCE | Source: Ambulatory Visit | Attending: Internal Medicine | Admitting: Internal Medicine

## 2014-12-30 ENCOUNTER — Encounter (HOSPITAL_COMMUNITY)
Admission: RE | Disposition: A | Discharge status: Home Health | Payer: Self-pay | Source: Ambulatory Visit | Attending: Internal Medicine

## 2014-12-30 ENCOUNTER — Encounter (HOSPITAL_COMMUNITY): Payer: Self-pay | Admitting: *Deleted

## 2014-12-30 DIAGNOSIS — K229 Disease of esophagus, unspecified: Secondary | ICD-10-CM

## 2014-12-30 DIAGNOSIS — R197 Diarrhea, unspecified: Secondary | ICD-10-CM | POA: Diagnosis not present

## 2014-12-30 DIAGNOSIS — K210 Gastro-esophageal reflux disease with esophagitis: Secondary | ICD-10-CM | POA: Insufficient documentation

## 2014-12-30 DIAGNOSIS — K2289 Other specified disease of esophagus: Secondary | ICD-10-CM | POA: Insufficient documentation

## 2014-12-30 DIAGNOSIS — Z7984 Long term (current) use of oral hypoglycemic drugs: Secondary | ICD-10-CM | POA: Diagnosis not present

## 2014-12-30 DIAGNOSIS — Z79899 Other long term (current) drug therapy: Secondary | ICD-10-CM | POA: Insufficient documentation

## 2014-12-30 DIAGNOSIS — K449 Diaphragmatic hernia without obstruction or gangrene: Secondary | ICD-10-CM | POA: Diagnosis not present

## 2014-12-30 DIAGNOSIS — E7800 Pure hypercholesterolemia, unspecified: Secondary | ICD-10-CM | POA: Insufficient documentation

## 2014-12-30 DIAGNOSIS — E119 Type 2 diabetes mellitus without complications: Secondary | ICD-10-CM | POA: Diagnosis not present

## 2014-12-30 DIAGNOSIS — Z8719 Personal history of other diseases of the digestive system: Secondary | ICD-10-CM | POA: Diagnosis not present

## 2014-12-30 DIAGNOSIS — N189 Chronic kidney disease, unspecified: Secondary | ICD-10-CM | POA: Insufficient documentation

## 2014-12-30 DIAGNOSIS — R1310 Dysphagia, unspecified: Secondary | ICD-10-CM | POA: Diagnosis present

## 2014-12-30 DIAGNOSIS — M797 Fibromyalgia: Secondary | ICD-10-CM | POA: Insufficient documentation

## 2014-12-30 DIAGNOSIS — I129 Hypertensive chronic kidney disease with stage 1 through stage 4 chronic kidney disease, or unspecified chronic kidney disease: Secondary | ICD-10-CM | POA: Diagnosis not present

## 2014-12-30 DIAGNOSIS — R1013 Epigastric pain: Secondary | ICD-10-CM | POA: Diagnosis not present

## 2014-12-30 DIAGNOSIS — K2210 Ulcer of esophagus without bleeding: Secondary | ICD-10-CM | POA: Diagnosis not present

## 2014-12-30 DIAGNOSIS — K219 Gastro-esophageal reflux disease without esophagitis: Secondary | ICD-10-CM

## 2014-12-30 DIAGNOSIS — R112 Nausea with vomiting, unspecified: Secondary | ICD-10-CM | POA: Diagnosis not present

## 2014-12-30 HISTORY — PX: MALONEY DILATION: SHX5535

## 2014-12-30 HISTORY — PX: ESOPHAGOGASTRODUODENOSCOPY: SHX5428

## 2014-12-30 LAB — GLUCOSE, CAPILLARY: Glucose-Capillary: 165 mg/dL — ABNORMAL HIGH (ref 65–99)

## 2014-12-30 SURGERY — EGD (ESOPHAGOGASTRODUODENOSCOPY)
Anesthesia: Moderate Sedation

## 2014-12-30 MED ORDER — MEPERIDINE HCL 100 MG/ML IJ SOLN
INTRAMUSCULAR | Status: DC | PRN
  Administered 2014-12-30: 25 mg via INTRAVENOUS
  Administered 2014-12-30: 50 mg via INTRAVENOUS
  Administered 2014-12-30: 25 mg via INTRAVENOUS

## 2014-12-30 MED ORDER — ONDANSETRON HCL 4 MG/2ML IJ SOLN
INTRAMUSCULAR | Status: AC
  Filled 2014-12-30: qty 2

## 2014-12-30 MED ORDER — MEPERIDINE HCL 100 MG/ML IJ SOLN
INTRAMUSCULAR | Status: AC
  Filled 2014-12-30: qty 2

## 2014-12-30 MED ORDER — ONDANSETRON HCL 4 MG/2ML IJ SOLN
INTRAMUSCULAR | Status: DC | PRN
  Administered 2014-12-30: 4 mg via INTRAVENOUS

## 2014-12-30 MED ORDER — SODIUM CHLORIDE 0.9 % IV SOLN
INTRAVENOUS | Status: DC
  Administered 2014-12-30: 07:00:00 via INTRAVENOUS

## 2014-12-30 MED ORDER — MIDAZOLAM HCL 5 MG/5ML IJ SOLN
INTRAMUSCULAR | Status: DC | PRN
  Administered 2014-12-30: 1 mg via INTRAVENOUS
  Administered 2014-12-30 (×2): 2 mg via INTRAVENOUS

## 2014-12-30 MED ORDER — MIDAZOLAM HCL 5 MG/5ML IJ SOLN
INTRAMUSCULAR | Status: AC
  Filled 2014-12-30: qty 10

## 2014-12-30 MED ORDER — LIDOCAINE VISCOUS 2 % MT SOLN
OROMUCOSAL | Status: AC
  Filled 2014-12-30: qty 15

## 2014-12-30 MED ORDER — STERILE WATER FOR IRRIGATION IR SOLN
Status: DC | PRN
  Administered 2014-12-30: 08:00:00

## 2014-12-30 NOTE — Interval H&P Note (Signed)
History and Physical Interval Note:  12/30/2014 7:42 AM  Meredith Leblanc  has presented today for surgery, with the diagnosis of GERD, dysphagia  The various methods of treatment have been discussed with the patient and family. After consideration of risks, benefits and other options for treatment, the patient has consented to  Procedure(s) with comments: ESOPHAGOGASTRODUODENOSCOPY (EGD) (N/A) - 7:30 [place] (N/A) as a surgical intervention .  The patient's history has been reviewed, patient examined, no change in status, stable for surgery.  I have reviewed the patient's chart and labs.  Questions were answered to the patient's satisfaction.     Meredith Leblanc  No change; EGD wED as appropriate / feasible per plan.  The risks, benefits, limitations, alternatives and imponderables have been reviewed with the patient. Potential for esophageal dilation, biopsy, etc. have also been reviewed.  Questions have been answered. All parties agreeable.

## 2014-12-30 NOTE — Op Note (Signed)
[facility] [address] [place], [address]   ENDOSCOPY PROCEDURE REPORT  PATIENT: Meredith, Leblanc  MR#: [id number] BIRTHDATE: [birth year] , 64  yrs. old GENDER: female ENDOSCOPIST: R.  Garfield Cornea, MD FACP FACG REFERRED BY:  Judd Lien, M.D. PROCEDURE DATE:  01/26/2015 PROCEDURE:  EGD w/ biopsy and Maloney dilation of esophagus INDICATIONS:  GERD; esophageal dysphagia. MEDICATIONS: Versed 5 mg IV and Demerol 100 mg IV in divided doses. Xylocaine gel orally.  Zofran 4 mg IV. ASA CLASS:      Class II  CONSENT: The risks, benefits, limitations, alternatives and imponderables have been discussed.  The potential for biopsy, esophogeal dilation, etc. have also been reviewed.  Questions have been answered.  All parties agreeable.  Please see the history and physical in the medical record for more information.  DESCRIPTION OF PROCEDURE: After the risks benefits and alternatives of the procedure were thoroughly explained, informed consent was obtained.  The [id number] endoscope was introduced through the mouth and advanced to the second portion of the duodenum , limited by Without limitations. The instrument was slowly withdrawn as the mucosa was fully examined. Estimated blood loss is zero unless otherwise noted in this procedure report.    Accentuated undulating Z line versus early short segment Barrett's esophagus.  Distal esophageal erosions within 5 mm the GE junction. No nodularity.  The tubular esophagus appeared patent throughout its course.  Stomach empty.  2 cm hiatal hernia.  Normal gastric mucosa.  Patent pylorus.  Normal-appearing first and second portion of the duodenum.  A 54 French Maloney dilator was passed to full insertion easily.  A look back revealed no apparent complication related to this maneuver.  Subsequently, the abnormal distal esophagus was biopsied.  Retroflexed views revealed as previously described. The scope was then  withdrawn from the patient and the procedure completed.  COMPLICATIONS: There were no immediate complications. EBL 2 mL ENDOSCOPIC IMPRESSION: Abnormal distal esophagus. Mild erosive reflux esophagitis. Query short segment Barrett's?"status post biopsy. Hiatal hernia. Status post passage of a Maloney dilator.  RECOMMENDATIONS: Short trial of Dexfilant 60 mg daily to achieve better symptom control. Strive for maximal control of blood sugars. Patient could have an element of gastroparesis creeping into the clinical picture.  Follow up on pathology.  REPEAT EXAM:  eSigned:  R. Garfield Cornea, MD Rosalita Chessman [facility] 01/26/15 8:24 AM    CC:  CPT CODES: ICD CODES:  The ICD and CPT codes recommended by this software are interpretations from the data that the clinical staff has captured with the software.  The verification of the translation of this report to the ICD and CPT codes and modifiers is the sole responsibility of the health care institution and practicing physician where this report was generated.  [place]. will not be held responsible for the validity of the ICD and CPT codes included on this report.  AMA assumes no liability for data contained or not contained herein. CPT is a Designer, television/film set of the [employer].  PATIENT NAME:  Meredith, Leblanc MR#: [id number]

## 2014-12-30 NOTE — Discharge Instructions (Addendum)
EGD Discharge instructions Please read the instructions outlined below and refer to this sheet in the next few weeks. These discharge instructions provide you with general information on caring for yourself after you leave the hospital. Your doctor may also give you specific instructions. While your treatment has been planned according to the most current medical practices available, unavoidable complications occasionally occur. If you have any problems or questions after discharge, please call your doctor. ACTIVITY  You may resume your regular activity but move at a slower pace for the next 24 hours.   Take frequent rest periods for the next 24 hours.   Walking will help expel (get rid of) the air and reduce the bloated feeling in your abdomen.   No driving for 24 hours (because of the anesthesia (medicine) used during the test).   You may shower.   Do not sign any important legal documents or operate any machinery for 24 hours (because of the anesthesia used during the test).  NUTRITION  Drink plenty of fluids.   You may resume your normal diet.   Begin with a light meal and progress to your normal diet.   Avoid alcoholic beverages for 24 hours or as instructed by your caregiver.  MEDICATIONS  You may resume your normal medications unless your caregiver tells you otherwise.  WHAT YOU CAN EXPECT TODAY  You may experience abdominal discomfort such as a feeling of fullness or gas pains.  FOLLOW-UP  Your doctor will discuss the results of your test with you.  SEEK IMMEDIATE MEDICAL ATTENTION IF ANY OF THE FOLLOWING OCCUR:  Excessive nausea (feeling sick to your stomach) and/or vomiting.   Severe abdominal pain and distention (swelling).   Trouble swallowing.   Temperature over 101 F (37.8 C).   Rectal bleeding or vomiting of blood.    GERD information provided  Stop Nexium; begin Dexilant 60 mg daily for 2 weeks-go by my office for free samples  Let us know how  Dexilant is working for you in 2 weeks and we will go from there  Further recommendations to follow pending review of pathology report  Gastroesophageal Reflux Disease, Adult Normally, food travels down the esophagus and stays in the stomach to be digested. However, when a person has gastroesophageal reflux disease (GERD), food and stomach acid move back up into the esophagus. When this happens, the esophagus becomes sore and inflamed. Over time, GERD can create small holes (ulcers) in the lining of the esophagus.  CAUSES This condition is caused by a problem with the muscle between the esophagus and the stomach (lower esophageal sphincter, or LES). Normally, the LES muscle closes after food passes through the esophagus to the stomach. When the LES is weakened or abnormal, it does not close properly, and that allows food and stomach acid to go back up into the esophagus. The LES can be weakened by certain dietary substances, medicines, and medical conditions, including:  Tobacco use.  Pregnancy.  Having a hiatal hernia.  Heavy alcohol use.  Certain foods and beverages, such as coffee, chocolate, onions, and peppermint. RISK FACTORS This condition is more likely to develop in:  People who have an increased body weight.  People who have connective tissue disorders.  People who use NSAID medicines. SYMPTOMS Symptoms of this condition include:  Heartburn.  Difficult or painful swallowing.  The feeling of having a lump in the throat.  Abitter taste in the mouth.  Bad breath.  Having a large amount of saliva.  Having an  upset or bloated stomach.  Belching.  Chest pain.  Shortness of breath or wheezing.  Ongoing (chronic) cough or a night-time cough.  Wearing away of tooth enamel.  Weight loss. Different conditions can cause chest pain. Make sure to see your health care provider if you experience chest pain. DIAGNOSIS Your health care provider will take a medical  history and perform a physical exam. To determine if you have mild or severe GERD, your health care provider may also monitor how you respond to treatment. You may also have other tests, including:  An endoscopy toexamine your stomach and esophagus with a small camera.  A test thatmeasures the acidity level in your esophagus.  A test thatmeasures how much pressure is on your esophagus.  A barium swallow or modified barium swallow to show the shape, size, and functioning of your esophagus. TREATMENT The goal of treatment is to help relieve your symptoms and to prevent complications. Treatment for this condition may vary depending on how severe your symptoms are. Your health care provider may recommend:  Changes to your diet.  Medicine.  Surgery. HOME CARE INSTRUCTIONS Diet  Follow a diet as recommended by your health care provider. This may involve avoiding foods and drinks such as:  Coffee and tea (with or without caffeine).  Drinks that containalcohol.  Energy drinks and sports drinks.  Carbonated drinks or sodas.  Chocolate and cocoa.  Peppermint and mint flavorings.  Garlic and onions.  Horseradish.  Spicy and acidic foods, including peppers, chili powder, curry powder, vinegar, hot sauces, and barbecue sauce.  Citrus fruit juices and citrus fruits, such as oranges, lemons, and limes.  Tomato-based foods, such as red sauce, chili, salsa, and pizza with red sauce.  Fried and fatty foods, such as donuts, french fries, potato chips, and high-fat dressings.  High-fat meats, such as hot dogs and fatty cuts of red and white meats, such as rib eye steak, sausage, ham, and bacon.  High-fat dairy items, such as whole milk, butter, and cream cheese.  Eat small, frequent meals instead of large meals.  Avoid drinking large amounts of liquid with your meals.  Avoid eating meals during the 2-3 hours before bedtime.  Avoid lying down right after you eat.  Do not  exercise right after you eat. General Instructions  Pay attention to any changes in your symptoms.  Take over-the-counter and prescription medicines only as told by your health care provider. Do not take aspirin, ibuprofen, or other NSAIDs unless your health care provider told you to do so.  Do not use any tobacco products, including cigarettes, chewing tobacco, and e-cigarettes. If you need help quitting, ask your health care provider.  Wear loose-fitting clothing. Do not wear anything tight around your waist that causes pressure on your abdomen.  Raise (elevate) the head of your bed 6 inches (15cm).  Try to reduce your stress, such as with yoga or meditation. If you need help reducing stress, ask your health care provider.  If you are overweight, reduce your weight to an amount that is healthy for you. Ask your health care provider for guidance about a safe weight loss goal.  Keep all follow-up visits as told by your health care provider. This is important. SEEK MEDICAL CARE IF:  You have new symptoms.  You have unexplained weight loss.  You have difficulty swallowing, or it hurts to swallow.  You have wheezing or a persistent cough.  Your symptoms do not improve with treatment.  You have a hoarse  voice. SEEK IMMEDIATE MEDICAL CARE IF:  You have pain in your arms, neck, jaw, teeth, or back.  You feel sweaty, dizzy, or light-headed.  You have chest pain or shortness of breath.  You vomit and your vomit looks like blood or coffee grounds.  You faint.  Your stool is bloody or black.  You cannot swallow, drink, or eat.   This information is not intended to replace advice given to you by your health care provider. Make sure you discuss any questions you have with your health care provider.

## 2014-12-30 NOTE — H&P (View-Only) (Signed)
Primary Care Physician:  Curlene Labrum, MD Primary Gastroenterologist:  Dr. Gala Romney  Chief Complaint  Patient presents with  . Diarrhea  . Nausea  . Emesis    HPI:   64 year old female presents on referral from PCP for multiple complaints. Last visit with her PCP on 12/11/2014, PCP notes and labs reviewed. At last visit with PCP patient was nauseous, having bloating and then diarrhea. Diarrhea is persistent 5 times a day with urgency. Has chronic diarrhea however symptoms have become significantly worse recently. History of gastric ulcers and H. pylori in the late 2000. Also with abdominal pain in the epigastric area described as aching, nausea and vomiting. Also complains of dysphagia. Labs are ordered CBC shows normal white blood cell count, normal H/H, normal lipase, amylase. H. pylori IgG negative however the patient appears to be on chronic Nexium and unsure of the accuracy of her H. pylori result. No results for stool studies included.  Today she states she is having worsening diarrhea. Also with epigastric pain and dysphagia. Started taking Nexium again this week. Dysphagia symptoms occur with every meal, feels like food sits at the epigastrum and will have nocturnal regurgitation at times. Also has N/V occasionally when bending forward. States CT abdomen was negative. Also with bloating. Symptoms worse when she eats a large meal. Having a bowel movement about 5-6 times a day and is loose, watery, non-bloody at times and at other times not watery and more just soft. Denies hematochezia and melena. Denies any other abdominal pain besides the epigastric pain. Denies chest pain, dyspnea, dizziness, lightheadedness, syncope, near syncope. Denies any other upper or lower GI symptoms.  Past Medical History  Diagnosis Date  . Hypercholesteremia   . Diabetes mellitus without complication ([place])   . Glaucoma, narrow-angle   . Chronic kidney disease     elevated bun and creat at times  .  Fibromyalgia   . Hypertension     Past Surgical History  Procedure Laterality Date  . Refractive surgery Bilateral     for narrow angle glaucoma  . Appendectomy    . Cholecystectomy    . Cataract extraction w/phaco Right 05/26/2013    Procedure: CATARACT EXTRACTION PHACO AND INTRAOCULAR LENS PLACEMENT (IOC);  Surgeon: Tonny Branch, MD;  Location: [facility];  Service: Ophthalmology;  Laterality: Right;  CDE 13.40  . Cataract extraction w/phaco Left 06/05/2013    Procedure: CATARACT EXTRACTION PHACO AND INTRAOCULAR LENS PLACEMENT (IOC);  Surgeon: Tonny Branch, MD;  Location: [facility];  Service: Ophthalmology;  Laterality: Left;  CDE 11.21    Current Outpatient Prescriptions  Medication Sig Dispense Refill  . calcium-vitamin D (OSCAL WITH D) 500-200 MG-UNIT per tablet Take 1 tablet by mouth 2 (two) times daily.    Marland Kitchen esomeprazole (NEXIUM) 40 MG capsule TAKE 1 CAPSULE BY MOUTH DAILY FOR REFLUX  3  . GLIPIZIDE ER PO Take 5 mg by mouth 2 (two) times daily.    . metFORMIN (GLUCOPHAGE) 1000 MG tablet Take 1,000 mg by mouth 2 (two) times daily with a meal.    . rosuvastatin (CRESTOR) 10 MG tablet Take 10 mg by mouth daily.    Marland Kitchen exenatide (BYETTA) 5 MCG/0.02ML SOPN injection Inject 5 mcg into the skin 2 (two) times daily with a meal.    . lisinopril (PRINIVIL,ZESTRIL) 5 MG tablet Take 5 mg by mouth daily.     No current facility-administered medications for this visit.    Allergies as of 12/17/2014  . (No Known Allergies)  No family history on file.  Social History   Social History  . Marital Status: Married    Spouse Name: N/A  . Number of Children: N/A  . Years of Education: N/A   Occupational History  . Not on file.   Social History Main Topics  . Smoking status: Never Smoker   . Smokeless tobacco: Not on file  . Alcohol Use: No  . Drug Use: No  . Sexual Activity: Yes    Birth Control/ Protection: Post-menopausal   Other Topics Concern  . Not on file   Social History Narrative      Review of Systems: General: Negative for anorexia, weight loss, fever, chills, fatigue, weakness. Eyes: Negative for vision changes.  ENT: Negative for hoarseness. CV: Negative for chest pain, angina, palpitations, peripheral edema.  Respiratory: Negative for dyspnea at rest, cough, sputum, wheezing.  GI: See history of present illness.  Derm: Negative for rash or itching.  Endo: Negative for unusual weight change.  Heme: Negative for bruising or bleeding. Allergy: Negative for rash or hives.    Physical Exam: BP 147/71 mmHg  Pulse 70  Temp(Src) 98.2 F (36.8 C) (Oral)  Ht 5' 3.5" (1.613 m)  Wt 176 lb (79.833 kg)  BMI 30.68 kg/m2 General:   Alert and oriented. Pleasant and cooperative. Well-nourished and well-developed.  Head:  Normocephalic and atraumatic. Eyes:  Without icterus, sclera clear and conjunctiva pink.  Ears:  Normal auditory acuity. Cardiovascular:  S1, S2 present without murmurs appreciated. Extremities without clubbing or edema. Respiratory:  Clear to auscultation bilaterally. No wheezes, rales, or rhonchi. No distress.  Gastrointestinal:  +BS, soft, and non-distended. Epigastric TTP. No HSM noted. No guarding or rebound. No masses appreciated.  Rectal:  Deferred  Neurologic:  Alert and oriented x4;  grossly normal neurologically. Psych:  Alert and cooperative. Normal mood and affect. Heme/Lymph/Immune: No excessive bruising noted.    12/17/2014 10:35 AM

## 2015-01-03 ENCOUNTER — Encounter: Payer: Self-pay | Admitting: Internal Medicine

## 2015-01-04 ENCOUNTER — Encounter (HOSPITAL_COMMUNITY): Payer: Self-pay | Admitting: Internal Medicine

## 2015-01-26 ENCOUNTER — Ambulatory Visit: Payer: PRIVATE HEALTH INSURANCE | Admitting: Nurse Practitioner

## 2015-02-04 ENCOUNTER — Encounter: Payer: Self-pay | Admitting: Nurse Practitioner

## 2015-02-04 ENCOUNTER — Ambulatory Visit (INDEPENDENT_AMBULATORY_CARE_PROVIDER_SITE_OTHER): Payer: PRIVATE HEALTH INSURANCE | Admitting: Nurse Practitioner

## 2015-02-04 VITALS — BP 143/74 | HR 85 | Temp 97.4°F | Ht 64.0 in | Wt 172.8 lb

## 2015-02-04 DIAGNOSIS — R1310 Dysphagia, unspecified: Secondary | ICD-10-CM | POA: Diagnosis not present

## 2015-02-04 DIAGNOSIS — K219 Gastro-esophageal reflux disease without esophagitis: Secondary | ICD-10-CM

## 2015-02-04 DIAGNOSIS — A09 Infectious gastroenteritis and colitis, unspecified: Secondary | ICD-10-CM

## 2015-02-04 DIAGNOSIS — R197 Diarrhea, unspecified: Secondary | ICD-10-CM

## 2015-02-04 NOTE — Assessment & Plan Note (Addendum)
Diarrhea resolved after short course of antibiotics for stool study showing positive staph aureus. Continue taking PPI. Return for follow-up as needed for any recurrent symptoms.  As an aside her last colonoscopy dated 10/26/2010 a see from [facility] which had mild diverticulosis in the left colon, pedunculated polyp in the sigmoid colon which was removed and found to be tubular adenoma. Recommend repeat in 5 years. She'll be due on 10/31/2015 or later. We'll place her on a recall list and notify her via telephone when she is next due.

## 2015-02-04 NOTE — Progress Notes (Signed)
cc'ed to pcp °

## 2015-02-04 NOTE — Assessment & Plan Note (Signed)
Improved after EGD with dilation. Return for follow-up as needed. Continue take PPI.

## 2015-02-04 NOTE — Progress Notes (Signed)
Referring Provider: Curlene Labrum, MD Primary Care Physician:  Curlene Labrum, MD Primary GI:  Dr. Gala Romney  Chief Complaint  Patient presents with  . Follow-up    HPI:   65 year-old female presents for follow-up on GERD, dysphagia, diarrhea. Last seen in our office 12/17/2014. At that time she started taking Nexium again due to recurrence of her GERD symptoms. Dysphagia with every meal and occasional nocturnal regurgitation diarrhea was 5-6 times a day, loose, watery, nonbloody. No abdominal pain or other GI symptoms. Her diarrhea was stated as being chronic although worsening as of late. She was scheduled for an endoscopy which is completed 12/30/2014 with [facility] dilation. Findings included abnormal distal esophagus, mild erosive reflux esophagitis, query short segment Barrett's esophagus status post biopsy, hiatal hernia. Maloney dilator passed without issue. Surgical pathology of the GE junction biopsy found esophagus gastric junction mucosa with reactive changes, negative for dysplasia or malignancy. She was started on a trial of Dexon 60 mg daily for better symptom control. Query element of gastroparesis creeping into the clinical picture. Noted to her diarrhea stool studies done by the PCP were negative.  Today she states her dysphagia is better. Did well with Dexilant, ran out of samples and wants to finish Nexium that she has. She states her stool studies came back with Staph and was placed on antibiotics and diarrhea has resolved. GERD is well controlled currently on her Nexium after completing course of Dexilant.  Denies hematochezia, melena, abdominal pain, N/V, diarrhea. Denies chest pain, dyspnea, dizziness, lightheadedness, syncope, near syncope. Denies any other upper or lower GI symptoms. States "I feel like I'm back to a normal person now."  Past Medical History  Diagnosis Date  . Hypercholesteremia   . Diabetes mellitus without complication ([place])   . Glaucoma,  narrow-angle   . Chronic kidney disease     elevated bun and creat at times  . Fibromyalgia   . Hypertension   . GERD (gastroesophageal reflux disease)   . Gastric ulcer   . Diabetic neuropathy [facility])     Past Surgical History  Procedure Laterality Date  . Refractive surgery Bilateral     for narrow angle glaucoma  . Appendectomy    . Cholecystectomy    . Cataract extraction w/phaco Right 05/26/2013    Procedure: CATARACT EXTRACTION PHACO AND INTRAOCULAR LENS PLACEMENT (IOC);  Surgeon: Tonny Branch, MD;  Location: [facility];  Service: Ophthalmology;  Laterality: Right;  CDE 13.40  . Cataract extraction w/phaco Left 06/05/2013    Procedure: CATARACT EXTRACTION PHACO AND INTRAOCULAR LENS PLACEMENT (IOC);  Surgeon: Tonny Branch, MD;  Location: [facility];  Service: Ophthalmology;  Laterality: Left;  CDE 11.21  . Colonoscopy      X 2  . Esophagogastroduodenoscopy    . Esophagogastroduodenoscopy N/A 12/30/2014    [id number] distal esophague/mild erosive reflux/query short segment barrett s/p bx/HH  . Maloney dilation N/A 12/30/2014    Procedure: Venia Minks DILATION;  Surgeon: Daneil Dolin, MD;  Location: [facility];  Service: Endoscopy;  Laterality: N/A;    Current Outpatient Prescriptions  Medication Sig Dispense Refill  . calcium-vitamin D (OSCAL WITH D) 500-200 MG-UNIT per tablet Take 1 tablet by mouth 2 (two) times daily.    Marland Kitchen esomeprazole (NEXIUM) 40 MG capsule TAKE 1 CAPSULE BY MOUTH DAILY FOR REFLUX  3  . GLIPIZIDE ER PO Take 5 mg by mouth 2 (two) times daily.    . metFORMIN (GLUCOPHAGE) 1000 MG tablet Take 1,000 mg by mouth  2 (two) times daily with a meal.    . rosuvastatin (CRESTOR) 10 MG tablet Take 10 mg by mouth daily.    Marland Kitchen sulfamethoxazole-trimethoprim (BACTRIM DS,SEPTRA DS) 800-160 MG tablet Take 1 tablet by mouth 2 (two) times daily. Reported on 02/04/2015     No current facility-administered medications for this visit.    Allergies as of 02/04/2015  . (No Known Allergies)     Family History  Problem Relation Age of Onset  . Colon cancer Neg Hx     Social History   Social History  . Marital Status: Married    Spouse Name: N/A  . Number of Children: N/A  . Years of Education: N/A   Social History Main Topics  . Smoking status: Never Smoker   . Smokeless tobacco: Never Used  . Alcohol Use: No  . Drug Use: No  . Sexual Activity: Yes    Birth Control/ Protection: Post-menopausal   Other Topics Concern  . None   Social History Narrative    Review of Systems: General: Negative for anorexia, weight loss, fever, chills, fatigue, weakness. ENT: Negative for hoarseness, difficulty swallowing. CV: Negative for chest pain, angina, palpitations, peripheral edema.  Respiratory: Negative for dyspnea at rest, cough, sputum, wheezing.  GI: See history of present illness. Endo: Negative for unusual weight change.  Heme: Negative for bruising or bleeding.   Physical Exam: BP 143/74 mmHg  Pulse 85  Temp(Src) 97.4 F (36.3 C)  Ht 5\' 4"  (1.626 m)  Wt 172 lb 12.8 oz (78.382 kg)  BMI 29.65 kg/m2 General:   Alert and oriented. Pleasant and cooperative. Well-nourished and well-developed.  Cardiovascular:  S1, S2 present without murmurs appreciated. Normal pulses noted. Extremities without clubbing or edema. Respiratory:  Clear to auscultation bilaterally. No wheezes, rales, or rhonchi. No distress.  Gastrointestinal:  +BS, soft, non-tender and non-distended. No HSM noted. No guarding or rebound. No masses appreciated.  Rectal:  Deferred  Neurologic:  Alert and oriented x4;  grossly normal neurologically. Psych:  Alert and cooperative. Normal mood and affect.    02/04/2015 11:20 AM

## 2015-02-04 NOTE — Patient Instructions (Signed)
1. Return for follow-up as needed 2. Continue Nexium 3. Next due for colonoscopy 10/2015

## 2015-02-04 NOTE — Assessment & Plan Note (Signed)
It is much improved/resolved after short course of Dexilant. She is returned to taking her Nexium as previous ordered because she has a large supply that. She prefers to stay on the Nexium. She will call us if she has any return of her symptoms. Return for follow-up as needed.

## 2015-03-22 ENCOUNTER — Encounter: Payer: Self-pay | Admitting: Internal Medicine

## 2015-05-05 ENCOUNTER — Ambulatory Visit: Payer: PRIVATE HEALTH INSURANCE | Admitting: Nurse Practitioner

## 2015-08-17 DIAGNOSIS — E119 Type 2 diabetes mellitus without complications: Secondary | ICD-10-CM | POA: Diagnosis not present

## 2015-08-17 DIAGNOSIS — E782 Mixed hyperlipidemia: Secondary | ICD-10-CM | POA: Diagnosis not present

## 2015-08-20 DIAGNOSIS — R112 Nausea with vomiting, unspecified: Secondary | ICD-10-CM | POA: Diagnosis not present

## 2015-08-20 DIAGNOSIS — E782 Mixed hyperlipidemia: Secondary | ICD-10-CM | POA: Diagnosis not present

## 2015-08-20 DIAGNOSIS — E119 Type 2 diabetes mellitus without complications: Secondary | ICD-10-CM | POA: Diagnosis not present

## 2015-08-20 DIAGNOSIS — Z1389 Encounter for screening for other disorder: Secondary | ICD-10-CM | POA: Diagnosis not present

## 2015-08-20 DIAGNOSIS — M797 Fibromyalgia: Secondary | ICD-10-CM | POA: Diagnosis not present

## 2015-08-20 DIAGNOSIS — E1143 Type 2 diabetes mellitus with diabetic autonomic (poly)neuropathy: Secondary | ICD-10-CM | POA: Diagnosis not present

## 2015-08-20 DIAGNOSIS — Z6830 Body mass index (BMI) 30.0-30.9, adult: Secondary | ICD-10-CM | POA: Diagnosis not present

## 2015-08-20 DIAGNOSIS — H4020X0 Unspecified primary angle-closure glaucoma, stage unspecified: Secondary | ICD-10-CM | POA: Diagnosis not present

## 2015-09-28 DIAGNOSIS — Z1231 Encounter for screening mammogram for malignant neoplasm of breast: Secondary | ICD-10-CM | POA: Diagnosis not present

## 2015-09-29 ENCOUNTER — Encounter: Payer: Self-pay | Admitting: Internal Medicine

## 2015-10-08 ENCOUNTER — Telehealth: Payer: Self-pay | Admitting: Gastroenterology

## 2015-10-08 NOTE — Telephone Encounter (Signed)
CONTACT PT TO TRIAGE FOR TCS. WANTS PREPOPIK AND FULL LIQUID DIET WITH DR. Keevan Wolz

## 2015-10-11 NOTE — Telephone Encounter (Signed)
Route to DS

## 2015-10-12 ENCOUNTER — Telehealth: Payer: Self-pay

## 2015-10-12 NOTE — Telephone Encounter (Signed)
Triaged pt today.

## 2015-10-20 ENCOUNTER — Other Ambulatory Visit: Payer: Self-pay

## 2015-10-20 DIAGNOSIS — Z1211 Encounter for screening for malignant neoplasm of colon: Secondary | ICD-10-CM

## 2015-10-20 MED ORDER — SOD PICOSULFATE-MAG OX-CIT ACD 10-3.5-12 MG-GM-GM PO PACK: 1.0000 | PACK | ORAL | 0 refills | Status: DC

## 2015-10-20 NOTE — Telephone Encounter (Addendum)
REVIEWED-HOLD GLIPIZIDE AM OF TCS. CONTINUE GLUCOPHAGE.

## 2015-10-20 NOTE — Telephone Encounter (Signed)
Gastroenterology Pre-Procedure Review  Request Date: 10/12/2015 Requesting Physician:   PATIENT REVIEW QUESTIONS: The patient responded to the following health history questions as indicated:    1. Diabetes Melitis: YES 2. Joint replacements in the past 12 months: no 3. Major health problems in the past 3 months: no 4. Has an artificial valve or MVP: no 5. Has a defibrillator: no 6. Has been advised in past to take antibiotics in advance of a procedure like teeth cleaning: no 7. Family history of colon cancer: no  8. Alcohol Use: no 9. History of sleep apnea: no     MEDICATIONS & ALLERGIES:    Patient reports the following regarding taking any blood thinners:   Plavix? no Aspirin? no Coumadin? no  Patient confirms/reports the following medications:  Current Outpatient Prescriptions  Medication Sig Dispense Refill  . esomeprazole (NEXIUM) 40 MG capsule TAKE 1 CAPSULE BY MOUTH DAILY FOR REFLUX  3  . GLIPIZIDE ER PO Take 5 mg by mouth 2 (two) times daily.    . metFORMIN (GLUCOPHAGE) 1000 MG tablet Take 1,000 mg by mouth 2 (two) times daily with a meal.    . rosuvastatin (CRESTOR) 10 MG tablet Take 10 mg by mouth daily.     No current facility-administered medications for this visit.     Patient confirms/reports the following allergies:  No Known Allergies  No orders of the defined types were placed in this encounter.   AUTHORIZATION INFORMATION Primary Insurance:   ID #:  Group #:  Pre-Cert / Auth required: Pre-Cert / Auth #:   Secondary Insurance:   ID #: Group #:  Pre-Cert / Auth required:  Pre-Cert / Auth #:   SCHEDULE INFORMATION: Procedure has been scheduled as follows:  Date:  11/05/2015               Time:  8:30 Location: [facility] Short Stay  This Gastroenterology Pre-Precedure Review Form is being routed to the following provider(s): Barney Drain, MD

## 2015-10-20 NOTE — Telephone Encounter (Signed)
FYI to Dr. Fields.  

## 2015-10-20 NOTE — Telephone Encounter (Signed)
Prepopik sent to the pharmacy per Dr.Fields previous note. Instructions mailed to pt.

## 2015-11-02 NOTE — Telephone Encounter (Signed)
Insurance does not cover the [employer].  I called pt and she is aware and she purchased the Prepopik anyway. She is aware of diabetic meds instructions.

## 2015-11-03 DIAGNOSIS — Z7984 Long term (current) use of oral hypoglycemic drugs: Secondary | ICD-10-CM | POA: Diagnosis not present

## 2015-11-03 DIAGNOSIS — E119 Type 2 diabetes mellitus without complications: Secondary | ICD-10-CM | POA: Diagnosis not present

## 2015-11-03 DIAGNOSIS — H4020X1 Unspecified primary angle-closure glaucoma, mild stage: Secondary | ICD-10-CM | POA: Diagnosis not present

## 2015-11-03 DIAGNOSIS — H40033 Anatomical narrow angle, bilateral: Secondary | ICD-10-CM | POA: Diagnosis not present

## 2015-11-05 ENCOUNTER — Encounter (HOSPITAL_COMMUNITY): Payer: Self-pay | Admitting: *Deleted

## 2015-11-05 ENCOUNTER — Ambulatory Visit (HOSPITAL_COMMUNITY)
Admission: RE | Admit: 2015-11-05 | Discharge: 2015-11-05 | Disposition: A | Discharge status: Home / Self Care | Payer: Medicare Other | Source: Ambulatory Visit | Attending: Gastroenterology | Admitting: Gastroenterology

## 2015-11-05 ENCOUNTER — Encounter (HOSPITAL_COMMUNITY)
Admission: RE | Disposition: A | Discharge status: Home / Self Care | Payer: Self-pay | Source: Ambulatory Visit | Attending: Gastroenterology

## 2015-11-05 DIAGNOSIS — E1122 Type 2 diabetes mellitus with diabetic chronic kidney disease: Secondary | ICD-10-CM | POA: Insufficient documentation

## 2015-11-05 DIAGNOSIS — K5730 Diverticulosis of large intestine without perforation or abscess without bleeding: Secondary | ICD-10-CM | POA: Diagnosis not present

## 2015-11-05 DIAGNOSIS — Z1211 Encounter for screening for malignant neoplasm of colon: Secondary | ICD-10-CM

## 2015-11-05 DIAGNOSIS — K648 Other hemorrhoids: Secondary | ICD-10-CM | POA: Insufficient documentation

## 2015-11-05 DIAGNOSIS — E7800 Pure hypercholesterolemia, unspecified: Secondary | ICD-10-CM | POA: Insufficient documentation

## 2015-11-05 DIAGNOSIS — Z7984 Long term (current) use of oral hypoglycemic drugs: Secondary | ICD-10-CM | POA: Insufficient documentation

## 2015-11-05 DIAGNOSIS — M797 Fibromyalgia: Secondary | ICD-10-CM | POA: Insufficient documentation

## 2015-11-05 DIAGNOSIS — Z86010 Personal history of colonic polyps: Secondary | ICD-10-CM | POA: Insufficient documentation

## 2015-11-05 DIAGNOSIS — Z8719 Personal history of other diseases of the digestive system: Secondary | ICD-10-CM | POA: Diagnosis not present

## 2015-11-05 DIAGNOSIS — H409 Unspecified glaucoma: Secondary | ICD-10-CM | POA: Diagnosis not present

## 2015-11-05 DIAGNOSIS — K219 Gastro-esophageal reflux disease without esophagitis: Secondary | ICD-10-CM | POA: Insufficient documentation

## 2015-11-05 DIAGNOSIS — I129 Hypertensive chronic kidney disease with stage 1 through stage 4 chronic kidney disease, or unspecified chronic kidney disease: Secondary | ICD-10-CM | POA: Diagnosis not present

## 2015-11-05 DIAGNOSIS — Z79899 Other long term (current) drug therapy: Secondary | ICD-10-CM | POA: Diagnosis not present

## 2015-11-05 DIAGNOSIS — Z8249 Family history of ischemic heart disease and other diseases of the circulatory system: Secondary | ICD-10-CM | POA: Diagnosis not present

## 2015-11-05 DIAGNOSIS — N189 Chronic kidney disease, unspecified: Secondary | ICD-10-CM | POA: Insufficient documentation

## 2015-11-05 HISTORY — PX: COLONOSCOPY: SHX5424

## 2015-11-05 LAB — GLUCOSE, CAPILLARY: GLUCOSE-CAPILLARY: 181 mg/dL — AB (ref 65–99)

## 2015-11-05 SURGERY — COLONOSCOPY
Anesthesia: Moderate Sedation

## 2015-11-05 MED ORDER — SODIUM CHLORIDE 0.9 % IV SOLN
INTRAVENOUS | Status: DC
  Administered 2015-11-05: 1000 mL via INTRAVENOUS

## 2015-11-05 MED ORDER — MIDAZOLAM HCL 5 MG/5ML IJ SOLN
INTRAMUSCULAR | Status: DC | PRN
  Administered 2015-11-05: 1 mg via INTRAVENOUS
  Administered 2015-11-05 (×2): 2 mg via INTRAVENOUS

## 2015-11-05 MED ORDER — MEPERIDINE HCL 100 MG/ML IJ SOLN
INTRAMUSCULAR | Status: DC | PRN
  Administered 2015-11-05 (×3): 25 mg via INTRAVENOUS

## 2015-11-05 MED ORDER — MEPERIDINE HCL 100 MG/ML IJ SOLN
INTRAMUSCULAR | Status: AC
  Filled 2015-11-05: qty 2

## 2015-11-05 MED ORDER — MIDAZOLAM HCL 5 MG/5ML IJ SOLN
INTRAMUSCULAR | Status: AC
  Filled 2015-11-05: qty 10

## 2015-11-05 NOTE — Discharge Instructions (Signed)
You have MODERATE SIZE internal hemorrhoids and diverticulosis IN YOUR LEFT COLON.   DRINK WATER TO KEEP YOUR URINE LIGHT YELLOW.  FOLLOW A HIGH FIBER DIET.  AVOID ITEMS THAT CAUSE BLOATING & GAS. See info below.  USE PREPARATION H FOUR TIMES A DAY IF NEEDED TO RELIEVE RECTAL PAIN/PRESSURE/BLEEDING.  Next colonoscopy in 5-10 years.  Colonoscopy Care After Read the instructions outlined below and refer to this sheet in the next week. These discharge instructions provide you with general information on caring for yourself after you leave the hospital. While your treatment has been planned according to the most current medical practices available, unavoidable complications occasionally occur. If you have any problems or questions after discharge, call DR. Cathey Fredenburg, [phone].  ACTIVITY  You may resume your regular activity, but move at a slower pace for the next 24 hours.   Take frequent rest periods for the next 24 hours.   Walking will help get rid of the air and reduce the bloated feeling in your belly (abdomen).   No driving for 24 hours (because of the medicine (anesthesia) used during the test).   You may shower.   Do not sign any important legal documents or operate any machinery for 24 hours (because of the anesthesia used during the test).    NUTRITION  Drink plenty of fluids.   You may resume your normal diet as instructed by your doctor.   Begin with a light meal and progress to your normal diet. Heavy or fried foods are harder to digest and may make you feel sick to your stomach (nauseated).   Avoid alcoholic beverages for 24 hours or as instructed.    MEDICATIONS  You may resume your normal medications.   WHAT YOU CAN EXPECT TODAY  Some feelings of bloating in the abdomen.   Passage of more gas than usual.   Spotting of blood in your stool or on the toilet paper  .  IF YOU HAD POLYPS REMOVED DURING THE COLONOSCOPY:  Eat a soft diet IF YOU HAVE  NAUSEA, BLOATING, ABDOMINAL PAIN, OR VOMITING.    FINDING OUT THE RESULTS OF YOUR TEST Not all test results are available during your visit. DR. Oneida Alar WILL CALL YOU WITHIN 14 DAYS OF YOUR PROCEDUE WITH YOUR RESULTS. Do not assume everything is normal if you have not heard from DR. Danasha Melman IN ONE WEEK, CALL HER OFFICE AT [phone].  SEEK IMMEDIATE MEDICAL ATTENTION AND CALL THE OFFICE: [phone] IF:  You have more than a spotting of blood in your stool.   Your belly is swollen (abdominal distention).   You are nauseated or vomiting.   You have a temperature over 101F.   You have abdominal pain or discomfort that is severe or gets worse throughout the day.  High-Fiber Diet A high-fiber diet changes your normal diet to include more whole grains, legumes, fruits, and vegetables. Changes in the diet involve replacing refined carbohydrates with unrefined foods. The calorie level of the diet is essentially unchanged. The Dietary Reference Intake (recommended amount) for adult males is 38 grams per day. For adult females, it is 25 grams per day. Pregnant and lactating women should consume 28 grams of fiber per day. Fiber is the intact part of a plant that is not broken down during digestion. Functional fiber is fiber that has been isolated from the plant to provide a beneficial effect in the body. PURPOSE  Increase stool bulk.   Ease and regulate bowel movements.   Lower cholesterol.  REDUCE RISK OF COLON CANCER  INDICATIONS THAT YOU NEED MORE FIBER  Constipation and hemorrhoids.   Uncomplicated diverticulosis (intestine condition) and irritable bowel syndrome.   Weight management.   As a protective measure against hardening of the arteries (atherosclerosis), diabetes, and cancer.   GUIDELINES FOR INCREASING FIBER IN THE DIET  Start adding fiber to the diet slowly. A gradual increase of about 5 more grams (2 slices of whole-wheat bread, 2 servings of most fruits or  vegetables, or 1 bowl of high-fiber cereal) per day is best. Too rapid an increase in fiber may result in constipation, flatulence, and bloating.   Drink enough water and fluids to keep your urine clear or pale yellow. Water, juice, or caffeine-free drinks are recommended. Not drinking enough fluid may cause constipation.   Eat a variety of high-fiber foods rather than one type of fiber.   Try to increase your intake of fiber through using high-fiber foods rather than fiber pills or supplements that contain small amounts of fiber.   The goal is to change the types of food eaten. Do not supplement your present diet with high-fiber foods, but replace foods in your present diet.   INCLUDE A VARIETY OF FIBER SOURCES  Replace refined and processed grains with whole grains, canned fruits with fresh fruits, and incorporate other fiber sources. White rice, white breads, and most bakery goods contain little or no fiber.   Brown whole-grain rice, buckwheat oats, and many fruits and vegetables are all good sources of fiber. These include: broccoli, Brussels sprouts, cabbage, cauliflower, beets, sweet potatoes, white potatoes (skin on), carrots, tomatoes, eggplant, squash, berries, fresh fruits, and dried fruits.   Cereals appear to be the richest source of fiber. Cereal fiber is found in whole grains and bran. Bran is the fiber-rich outer coat of cereal grain, which is largely removed in refining. In whole-grain cereals, the bran remains. In breakfast cereals, the largest amount of fiber is found in those with "bran" in their names. The fiber content is sometimes indicated on the label.   You may need to include additional fruits and vegetables each day.   In baking, for 1 cup white flour, you may use the following substitutions:   1 cup whole-wheat flour minus 2 tablespoons.   1/2 cup white flour plus 1/2 cup whole-wheat flour.   Diverticulosis Diverticulosis is a common condition that develops when  small pouches (diverticula) form in the wall of the colon. The risk of diverticulosis increases with age. It happens more often in people who eat a low-fiber diet. Most individuals with diverticulosis have no symptoms. Those individuals with symptoms usually experience belly (abdominal) pain, constipation, or loose stools (diarrhea).  HOME CARE INSTRUCTIONS  Increase the amount of fiber in your diet as directed by your caregiver or dietician. This may reduce symptoms of diverticulosis.   Drink at least 6 to 8 glasses of water each day to prevent constipation.   Try not to strain when you have a bowel movement.   Avoiding nuts and seeds to prevent complications is NOT NECESSARY.   FOODS HAVING HIGH FIBER CONTENT INCLUDE:  Fruits. Apple, peach, pear, tangerine, raisins, prunes.   Vegetables. Brussels sprouts, asparagus, broccoli, cabbage, carrot, cauliflower, romaine lettuce, spinach, summer squash, tomato, winter squash, zucchini.   Starchy Vegetables. Baked beans, kidney beans, lima beans, split peas, lentils, potatoes (with skin).   Grains. Whole wheat bread, brown rice, bran flake cereal, plain oatmeal, white rice, shredded wheat, bran muffins.   SEEK IMMEDIATE  MEDICAL CARE IF:  You develop increasing pain or severe bloating.   You have an oral temperature above 101F.   You develop vomiting or bowel movements that are bloody or black.   Hemorrhoids Hemorrhoids are dilated (enlarged) veins around the rectum. Sometimes clots will form in the veins. This makes them swollen and painful. These are called thrombosed hemorrhoids. Causes of hemorrhoids include:  Constipation.   Straining to have a bowel movement.   HEAVY LIFTING  HOME CARE INSTRUCTIONS  Eat a well balanced diet and drink 6 to 8 glasses of water every day to avoid constipation. You may also use a bulk laxative.   Avoid straining to have bowel movements.   Keep anal area dry and clean.   Do not use a donut  shaped pillow or sit on the toilet for long periods. This increases blood pooling and pain.   Move your bowels when your body has the urge; this will require less straining and will decrease pain and pressure.

## 2015-11-05 NOTE — H&P (Signed)
Primary Care Physician:  Curlene Labrum, MD Primary Gastroenterologist:  Dr. Oneida Alar  Pre-Procedure History & Physical: HPI:  Meredith Leblanc is a 65 y.o. female here for [place].  Past Medical History:  Diagnosis Date  . Chronic kidney disease    elevated bun and creat at times  . Diabetes mellitus without complication ([place])   . Diabetic neuropathy ([place])   . Fibromyalgia   . Gastric ulcer   . GERD (gastroesophageal reflux disease)   . Glaucoma, narrow-angle   . Hypercholesteremia   . Hypertension     Past Surgical History:  Procedure Laterality Date  . APPENDECTOMY    . CATARACT EXTRACTION W/PHACO Right 05/26/2013   Procedure: CATARACT EXTRACTION PHACO AND INTRAOCULAR LENS PLACEMENT (IOC);  Surgeon: Tonny Branch, MD;  Location: [facility] OR[facility];  Service: Ophthalmology;  Laterality: Right;  CDE 13.40  . CATARACT EXTRACTION W/PHACO Left 06/05/2013   Procedure: CATARACT EXTRACTION PHACO AND INTRAOCULAR LENS PLACEMENT (IOC);  Surgeon: Tonny Branch, MD;  Location: [facility] OR[facility];  Service: Ophthalmology;  Laterality: Left;  CDE 11.21  . CHOLECYSTECTOMY    . COLONOSCOPY     X 2  . ESOPHAGOGASTRODUODENOSCOPY    . ESOPHAGOGASTRODUODENOSCOPY N/A 12/30/2014   [id number] distal esophague/mild erosive reflux/query short segment barrett s/p bx/HH  . MALONEY DILATION N/A 12/30/2014   Procedure: Venia Minks DILATION;  Surgeon: Daneil Dolin, MD;  Location: [facility] SUITE;  Service: Endoscopy;  Laterality: N/A;  . REFRACTIVE SURGERY Bilateral    for narrow angle glaucoma    Prior to Admission medications   Medication Sig Start Date End Date Taking? Authorizing Provider  GLIPIZIDE ER PO Take 5 mg by mouth 2 (two) times daily.   Yes Historical Provider, MD  metFORMIN (GLUCOPHAGE) 1000 MG tablet Take 1,000 mg by mouth 2 (two) times daily with a meal.   Yes Historical Provider, MD  Sod Picosulfate-Mag Ox-Cit Acd 10-3.5-12 MG-GM-GM PACK Take 1 Container by mouth as directed. 10/20/15  Yes Danie Binder, MD  esomeprazole (NEXIUM) 40 MG capsule TAKE 1 CAPSULE BY MOUTH DAILY FOR REFLUX 12/11/14   Historical Provider, MD  rosuvastatin (CRESTOR) 10 MG tablet Take 10 mg by mouth daily.    Historical Provider, MD    Allergies as of 10/20/2015  . (No Known Allergies)    Family History  Problem Relation Age of Onset  . Osteoporosis Mother   . Hypertension Mother   . Hypertension Father   . Stroke Father   . Leukemia Brother   . Diabetes Sister   . Diabetes Sister   . Colon cancer Neg Hx     Social History   Social History  . Marital status: Married    Spouse name: N/A  . Number of children: N/A  . Years of education: N/A   Occupational History  . Not on file.   Social History Main Topics  . Smoking status: Never Smoker  . Smokeless tobacco: Never Used  . Alcohol use No  . Drug use: No  . Sexual activity: Yes    Birth control/ protection: Post-menopausal   Other Topics Concern  . Not on file   Social History Narrative  . No narrative on file    Review of Systems: See HPI, otherwise negative ROS   Physical Exam: BP 116/72   Pulse 76   Temp 98 F (36.7 C) (Oral)   Resp (!) 22   Ht 5\' 4"  (1.626 m)   Wt 168 lb (76.2 kg)   SpO2 100%  BMI 28.84 kg/m  General:   Alert,  pleasant and cooperative in NAD Head:  Normocephalic and atraumatic. Neck:  Supple; Lungs:  Clear throughout to auscultation.    Heart:  Regular rate and rhythm. Abdomen:  Soft, nontender and nondistended. Normal bowel sounds, without guarding, and without rebound.   Neurologic:  Alert and  oriented x4;  grossly normal neurologically.  Impression/Plan:     SCREENING  Plan:  1. TCS TODAY

## 2015-11-05 NOTE — Op Note (Addendum)
[facility] Patient Name: Meredith Leblanc Procedure Date: 11/05/2015 8:55 AM MRN: [id number] Date of Birth: 07/17/1950 Attending MD: Barney Drain , MD CSN: [id number] Age: 65 Admit Type: Outpatient Procedure:                Colonoscopy, SCREENING Indications:              High risk colon cancer surveillance: Personal                            history of colonic polyps: LAST TCS 2012 WITH LARGE                            SIMPLE ADENOMA REMOVED. Providers:                Barney Drain, MD, Rosina Lowenstein, RN, Randa Spike,                            Technician Referring MD:             Curlene Labrum, Norvel Richards, MD Medicines:                Meperidine 75 mg IV, Midazolam 5 mg IV Complications:            No immediate complications. Estimated Blood Loss:     Estimated blood loss: none. Procedure:                Pre-Anesthesia Assessment:                           - Prior to the procedure, a History and Physical                            was performed, and patient medications and                            allergies were reviewed. The patient's tolerance of                            previous anesthesia was also reviewed. The risks                            and benefits of the procedure and the sedation                            options and risks were discussed with the patient.                            All questions were answered, and informed consent                            was obtained. Prior Anticoagulants: The patient has                            taken no previous anticoagulant or antiplatelet  agents. ASA Grade Assessment: II - A patient with                            mild systemic disease. After reviewing the risks                            and benefits, the patient was deemed in                            satisfactory condition to undergo the procedure.                            After obtaining informed consent, the  colonoscope                            was passed under direct vision. Throughout the                            procedure, the patient's blood pressure, pulse, and                            oxygen saturations were monitored continuously. The                            [id number] scope was introduced through                            the anus and advanced to the the cecum, identified                            by appendiceal orifice and ileocecal valve. The                            ileocecal valve, appendiceal orifice, and rectum                            were photographed. The patient tolerated the                            procedure well. The quality of the bowel                            preparation was good. The colonoscopy was somewhat                            difficult due to significant looping. Successful                            completion of the procedure was aided by applying                            abdominal pressure and COLOWRAP. Scope In: 9:44:00 AM Scope Out: 10:03:47 AM Scope Withdrawal Time: 0 hours 13 minutes 51 seconds  Total  Procedure Duration: 0 hours 19 minutes 47 seconds  Findings:      The digital rectal exam was normal.      Multiple small and large-mouthed diverticula were found in the sigmoid       colon.      Non-bleeding internal hemorrhoids were found. The hemorrhoids were       moderate.      The recto-sigmoid colon and sigmoid colon were moderately redundant. AND       COLOWRAP/ABDOMINAL PRESSuRE WAS USED TO REACH CECUM. Impression:               - Diverticulosis in the sigmoid colon.                           - Non-bleeding internal hemorrhoids.                           - Redundant LEFT colon. Moderate Sedation:      Moderate (conscious) sedation was administered by the endoscopy nurse       and supervised by the endoscopist. The following parameters were       monitored: oxygen saturation, heart rate, blood pressure, and  response       to care. Total physician intraservice time was 32 minutes. Recommendation:           - High fiber diet.                           - Continue present medications.                           - Repeat colonoscopy in 5-10 years for surveillance                            DUE TO A PERSONAL HISTORY OF POLYPS.                           - Patient has a contact number available for                            emergencies. The signs and symptoms of potential                            delayed complications were discussed with the                            patient. Return to normal activities tomorrow.                            Written discharge instructions were provided to the                            patient. Procedure Code(s):        --- Professional ---                           [phone], Colonoscopy, flexible; diagnostic, including  collection of specimen(s) by brushing or washing,                            when performed (separate procedure)                           99152, Moderate sedation services provided by the                            same physician or other qualified health care                            professional performing the diagnostic or                            therapeutic service that the sedation supports,                            requiring the presence of an independent trained                            observer to assist in the monitoring of the                            patient's level of consciousness and physiological                            status; initial 15 minutes of intraservice time,                            patient age 33 years or older                           [phone], Moderate sedation services; each additional                            15 minutes intraservice time Diagnosis Code(s):        --- Professional ---                           Z86.010, Personal history of colonic polyps                            K64.8, Other hemorrhoids                           K57.30, Diverticulosis of large intestine without                            perforation or abscess without bleeding                           Q43.8, Other specified congenital malformations of                            intestine  CPT copyright 2016 [employer]. All rights reserved. The codes documented in this report are preliminary and upon coder review may  be revised to meet current compliance requirements. Barney Drain, MD Barney Drain, MD 11/05/2015 10:24:53 AM This report has been signed electronically. Number of Addenda: 0

## 2015-11-09 ENCOUNTER — Encounter (HOSPITAL_COMMUNITY): Payer: Self-pay | Admitting: Gastroenterology

## 2015-11-21 ENCOUNTER — Emergency Department (HOSPITAL_COMMUNITY): Payer: Medicare Other

## 2015-11-21 ENCOUNTER — Encounter (HOSPITAL_COMMUNITY): Payer: Self-pay | Admitting: Emergency Medicine

## 2015-11-21 ENCOUNTER — Emergency Department (HOSPITAL_COMMUNITY)
Admission: EM | Admit: 2015-11-21 | Discharge: 2015-11-21 | Disposition: A | Discharge status: Home / Self Care | Payer: Medicare Other | Attending: Emergency Medicine | Admitting: Emergency Medicine

## 2015-11-21 DIAGNOSIS — E1122 Type 2 diabetes mellitus with diabetic chronic kidney disease: Secondary | ICD-10-CM | POA: Diagnosis not present

## 2015-11-21 DIAGNOSIS — Z7984 Long term (current) use of oral hypoglycemic drugs: Secondary | ICD-10-CM | POA: Insufficient documentation

## 2015-11-21 DIAGNOSIS — R1010 Upper abdominal pain, unspecified: Secondary | ICD-10-CM | POA: Diagnosis not present

## 2015-11-21 DIAGNOSIS — Z79899 Other long term (current) drug therapy: Secondary | ICD-10-CM | POA: Diagnosis not present

## 2015-11-21 DIAGNOSIS — I129 Hypertensive chronic kidney disease with stage 1 through stage 4 chronic kidney disease, or unspecified chronic kidney disease: Secondary | ICD-10-CM | POA: Insufficient documentation

## 2015-11-21 DIAGNOSIS — R079 Chest pain, unspecified: Secondary | ICD-10-CM | POA: Diagnosis not present

## 2015-11-21 DIAGNOSIS — N189 Chronic kidney disease, unspecified: Secondary | ICD-10-CM | POA: Diagnosis not present

## 2015-11-21 DIAGNOSIS — R0789 Other chest pain: Secondary | ICD-10-CM | POA: Diagnosis not present

## 2015-11-21 LAB — BASIC METABOLIC PANEL
Anion gap: 10 (ref 5–15)
BUN: 14 mg/dL (ref 6–20)
CO2: 21 mmol/L — ABNORMAL LOW (ref 22–32)
Calcium: 10.3 mg/dL (ref 8.9–10.3)
Chloride: 105 mmol/L (ref 101–111)
Creatinine, Ser: 0.64 mg/dL (ref 0.44–1.00)
GFR calc Af Amer: >60 mL/min (ref >60)
GFR calc non Af Amer: >60 mL/min (ref >60)
Glucose, Bld: 153 mg/dL — ABNORMAL HIGH (ref 65–99)
Potassium: 4.4 mmol/L (ref 3.5–5.1)
Sodium: 136 mmol/L (ref 135–145)

## 2015-11-21 LAB — CBG MONITORING, ED: Glucose-Capillary: 81 mg/dL (ref 65–99)

## 2015-11-21 LAB — CBC
HCT: 39.9 % (ref 36.0–46.0)
HEMOGLOBIN: 13.7 g/dL (ref 12.0–15.0)
MCH: 29.1 pg (ref 26.0–34.0)
MCHC: 34.3 g/dL (ref 30.0–36.0)
MCV: 84.9 fL (ref 78.0–100.0)
Platelets: 328 10*3/uL (ref 150–400)
RBC: 4.7 MIL/uL (ref 3.87–5.11)
RDW: 13.5 % (ref 11.5–15.5)
WBC: 8.4 10*3/uL (ref 4.0–10.5)

## 2015-11-21 LAB — D-DIMER, QUANTITATIVE: D-Dimer, Quant: <0.27 ug/mL-FEU (ref 0.00–0.50)

## 2015-11-21 LAB — HEPATIC FUNCTION PANEL
ALBUMIN: 4.5 g/dL (ref 3.5–5.0)
ALK PHOS: 73 U/L (ref 38–126)
ALT: 22 U/L (ref 14–54)
AST: 29 U/L (ref 15–41)
BILIRUBIN DIRECT: 0.1 mg/dL (ref 0.1–0.5)
BILIRUBIN TOTAL: 0.7 mg/dL (ref 0.3–1.2)
Indirect Bilirubin: 0.6 mg/dL (ref 0.3–0.9)
Total Protein: 8.2 g/dL — ABNORMAL HIGH (ref 6.5–8.1)

## 2015-11-21 LAB — TROPONIN I: Troponin I: <0.03 ng/mL (ref <0.03)

## 2015-11-21 LAB — LIPASE, BLOOD: Lipase: 30 U/L (ref 11–51)

## 2015-11-21 MED ORDER — ASPIRIN 325 MG PO TABS
325.0000 mg | ORAL_TABLET | Freq: Once | ORAL | Status: AC
  Administered 2015-11-21: 325 mg via ORAL
  Filled 2015-11-21: qty 1

## 2015-11-21 MED ORDER — FENTANYL CITRATE (PF) 100 MCG/2ML IJ SOLN
50.0000 ug | Freq: Once | INTRAMUSCULAR | Status: DC
  Filled 2015-11-21: qty 2

## 2015-11-21 MED ORDER — IOPAMIDOL (ISOVUE-370) INJECTION 76%
100.0000 mL | Freq: Once | INTRAVENOUS | Status: AC | PRN
  Administered 2015-11-21: 100 mL via INTRAVENOUS

## 2015-11-21 NOTE — ED Triage Notes (Signed)
Patient states right sided chest pain that started this morning with light headedness, weakness, and nausea.

## 2015-11-21 NOTE — ED Provider Notes (Signed)
[facility] DEPT Provider Note   CSN: [id number] Arrival date & time: 11/21/15  [id number]  By signing my name below, I, Reola Mosher, attest that this documentation has been prepared under the direction and in the presence of Forde Dandy, MD. Electronically Signed: Reola Mosher, ED Scribe. 11/21/15. 9:51 AM.  History   Chief Complaint Chief Complaint  Patient presents with  . Chest Pain  . Shortness of Breath   The history is provided by the patient. No language interpreter was used.   HPI Comments: Meredith Leblanc is a 65 y.o. female with a h/o DM, HLD,and GERD, who presents to the Emergency Department complaining of sudden onset, gradually improving, right-sided chest/ribcage pain onset this morning. States that the pain radiates to the right side of her back. She rates her pain as a 4/10, and describes her pain as sharp. Pt was sitting down and not performing exertional activities during the onset of her pain. She reports associated nausea, light-headedness, and weakness secondary to the onset of her pain and which are all gradually improving with her pain. Pt additionally notes that she has had to work harder at breathing which she attributes to her anxiety of her pain; however, states that it does not feel like she is short of breath. No h/o similar pain or prior cardiac issues/events. No exacerbating or alleviating factors for her pain. Pt additionally notes that ~1 week ago she was sick with URI-like symptoms and two episodes of nausea and vomiting; however, her symptoms have since resolved. No hx/FHx of prior DVT/PE. Pt has a PSHx of prior cholecystectomy and appendectomy. Denies vomiting, diaphoresis, leg swelling, lower extremity pain, abdominal pain, or any other associated symptoms.   PCP: Curlene Labrum, MD  Past Medical History:  Diagnosis Date  . Chronic kidney disease    elevated bun and creat at times  . Diabetes mellitus without complication ([place])   .  Diabetic neuropathy ([place])   . Fibromyalgia   . Gastric ulcer   . GERD (gastroesophageal reflux disease)   . Glaucoma, narrow-angle   . Hypercholesteremia   . Hypertension    Patient Active Problem List   Diagnosis Date Noted  . Special screening for malignant neoplasms, colon   . Hiatal hernia   . Mucosal abnormality of esophagus   . GERD (gastroesophageal reflux disease) 12/17/2014  . Dysphagia 12/17/2014  . Diarrhea 12/17/2014   Past Surgical History:  Procedure Laterality Date  . APPENDECTOMY    . CATARACT EXTRACTION W/PHACO Right 05/26/2013   Procedure: CATARACT EXTRACTION PHACO AND INTRAOCULAR LENS PLACEMENT (IOC);  Surgeon: Tonny Branch, MD;  Location: [facility] ORS;  Service: Ophthalmology;  Laterality: Right;  CDE 13.40  . CATARACT EXTRACTION W/PHACO Left 06/05/2013   Procedure: CATARACT EXTRACTION PHACO AND INTRAOCULAR LENS PLACEMENT (IOC);  Surgeon: Tonny Branch, MD;  Location: [facility];  Service: Ophthalmology;  Laterality: Left;  CDE 11.21  . CHOLECYSTECTOMY    . COLONOSCOPY     X 2  . COLONOSCOPY N/A 11/05/2015   Procedure: COLONOSCOPY;  Surgeon: Danie Binder, MD;  Location: [facility] SUITE;  Service: Endoscopy;  Laterality: N/A;  8 :30 AM  . ESOPHAGOGASTRODUODENOSCOPY    . ESOPHAGOGASTRODUODENOSCOPY N/A 12/30/2014   [id number] distal esophague/mild erosive reflux/query short segment barrett s/p bx/HH  . MALONEY DILATION N/A 12/30/2014   Procedure: Venia Minks DILATION;  Surgeon: Daneil Dolin, MD;  Location: [facility];  Service: Endoscopy;  Laterality: N/A;  . REFRACTIVE SURGERY Bilateral    for  narrow angle glaucoma   OB History    No data available     Home Medications    Prior to Admission medications   Medication Sig Start Date End Date Taking? Authorizing Provider  DULoxetine (CYMBALTA) 30 MG capsule Take 30 mg by mouth daily.   Yes Historical Provider, MD  esomeprazole (NEXIUM) 40 MG capsule Take 40 mg by mouth daily as needed (acid reflux).   Yes Historical  Provider, MD  GLIPIZIDE ER PO Take 5 mg by mouth 2 (two) times daily.   Yes Historical Provider, MD  GLIPIZIDE XL 10 MG 24 hr tablet Take 1 tablet by mouth 2 (two) times daily. 10/09/15  Yes Historical Provider, MD  metFORMIN (GLUCOPHAGE) 1000 MG tablet Take 1,000 mg by mouth 2 (two) times daily with a meal.   Yes Historical Provider, MD  rosuvastatin (CRESTOR) 20 MG tablet Take 20 mg by mouth daily.   Yes Historical Provider, MD   Family History Family History  Problem Relation Age of Onset  . Osteoporosis Mother   . Hypertension Mother   . Hypertension Father   . Stroke Father   . Leukemia Brother   . Diabetes Sister   . Diabetes Sister   . Colon cancer Neg Hx    Social History Social History  Substance Use Topics  . Smoking status: Never Smoker  . Smokeless tobacco: Never Used  . Alcohol use No   Allergies   Byetta 10 mcg pen [exenatide]  Review of Systems Review of Systems 10/14 systems reviewed and are negative other than those stated in the HPI  Physical Exam Updated Vital Signs BP 164/71   Pulse 80   Temp 97.9 F (36.6 C) (Oral)   Resp 19   Ht 5\' 4"  (1.626 m)   Wt 165 lb (74.8 kg)   SpO2 98%   BMI 28.32 kg/m   Physical Exam Physical Exam  Nursing note and vitals reviewed. Constitutional: Well developed, well nourished, non-toxic, and in no acute distress Head: Normocephalic and atraumatic.  Mouth/Throat: Oropharynx is clear and moist.  Neck: Normal range of motion. Neck supple.  Cardiovascular: Normal rate and regular rhythm.   Pulmonary/Chest: Effort normal and breath sounds normal. No chest wall TTP.  Abdominal: Soft. There is no tenderness. There is no rebound and no guarding.  Musculoskeletal: Normal range of motion.  Neurological: Alert, no facial droop, fluent speech, moves all extremities symmetrically Skin: Skin is warm and dry.  Psychiatric: Cooperative  ED Treatments / Results  DIAGNOSTIC STUDIES: Oxygen Saturation is 100% on RA, normal by  my interpretation.   COORDINATION OF CARE: 9:51 AM-Discussed next steps with pt. Pt verbalized understanding and is agreeable with the plan.   Labs (all labs ordered are listed, but only abnormal results are displayed) Labs Reviewed  BASIC METABOLIC PANEL - Abnormal; Notable for the following:       Result Value   CO2 21 (*)    Glucose, Bld 153 (*)    All other components within normal limits  HEPATIC FUNCTION PANEL - Abnormal; Notable for the following:    Total Protein 8.2 (*)    All other components within normal limits  CBC  TROPONIN I  D-DIMER, QUANTITATIVE (NOT AT [facility])  LIPASE, BLOOD  TROPONIN I  CBG MONITORING, ED   EKG  EKG Interpretation  Date/Time:  Sunday November 21 2015 13:33:57 EDT Ventricular Rate:  73 PR Interval:    QRS Duration: 99 QT Interval:  401 QTC Calculation: 442 R Axis:  15 Text Interpretation:  Sinus rhythm Low voltage, precordial leads Consider anterior infarct no acute changes  Confirmed by Mahoganie Basher MD, Brandelyn Henne [phone]) on 11/21/2015 2:29:38 PM      Radiology Dg Chest 2 View  Result Date: 11/21/2015 CLINICAL DATA:  Right-sided chest pain EXAM: CHEST  2 VIEW COMPARISON:  None. FINDINGS: The heart size and mediastinal contours are within normal limits. Both lungs are clear. The visualized skeletal structures are unremarkable. IMPRESSION: No active cardiopulmonary disease. Electronically Signed   By: Inez Catalina M.D.   On: 11/21/2015 10:24   Ct Angio Chest/abd/pel For Dissection W And/or W/wo  Result Date: 11/21/2015 CLINICAL DATA:  Chest pain, right upper back pain, possible aortic dissection EXAM: CT ANGIOGRAPHY CHEST, ABDOMEN AND PELVIS TECHNIQUE: Multidetector CT imaging through the chest, abdomen and pelvis was performed using the standard protocol during bolus administration of intravenous contrast. Multiplanar reconstructed images and MIPs were obtained and reviewed to evaluate the vascular anatomy. CONTRAST:  100 cc Isovue COMPARISON:  None.  FINDINGS: CTA CHEST FINDINGS Cardiovascular: The study is of excellent technical quality. There is no evidence of aortic aneurysm or aortic dissection. Minimal atherosclerotic calcifications of aortic knob. The pulmonary artery is unremarkable. No central pulmonary embolus is noted. Mediastinum/Nodes: No mediastinal hematoma or adenopathy. No hilar adenopathy. Lungs/Pleura: Images of the lung parenchyma shows no acute infiltrate or pleural effusion. No pulmonary edema. There is no pneumothorax. No bronchiectasis. No emphysematous changes. Musculoskeletal: No destructive bony lesions are noted. Sagittal images of the spine are unremarkable. Review of the MIP images confirms the above findings. CTA ABDOMEN AND PELVIS FINDINGS VASCULAR Aorta: Abdominal aorta is unremarkable. No aortic dissection. No aortic aneurysm. Celiac: Celiac trunk is patent. SMA: SMA is patent. Renals: Bilateral renal artery is patent IMA: Patent Visualized IVC is patent. Veins: Bilateral renal vein is patent. Review of the MIP images confirms the above findings. NON-VASCULAR Hepatobiliary: Enhanced liver shows mild fatty infiltration. The patient is status postcholecystectomy. Pancreas: Enhanced pancreas is unremarkable. Spleen: Enhanced spleen is unremarkable.  Splenic artery is patent. Adrenals/Urinary Tract: Adrenal glands are unremarkable. Kidneys are normal, without renal calculi, focal lesion, or hydronephrosis. Bladder is unremarkable. Stomach/Bowel: Stomach is within normal limits. Status post appendectomy No evidence of bowel wall thickening, distention, or inflammatory changes. Lymphatic: No retroperitoneal or mesenteric adenopathy. Reproductive: The uterus is anteflexed.  No adnexal mass. Other: Bilateral iliac arteries are patent. Femoral artery is patent bilaterally. Musculoskeletal: No destructive bony lesions are noted. Disc space flattening with vacuum disc phenomenon at L5-S1 level. Mild degenerative changes pubic symphysis.  Review of the MIP images confirms the above findings. IMPRESSION: 1. There is no evidence of aortic aneurysm or aortic dissection. 2. No mediastinal hematoma or adenopathy. 3. Mild fatty infiltration of the liver. 4. No central pulmonary embolus. 5. Patent celiac trunk, SMA, splenic artery and bilateral iliac arteries. 6. Status post appendectomy.  No small bowel obstruction. Electronically Signed   By: Lahoma Crocker M.D.   On: 11/21/2015 15:18    Procedures Procedures   Medications Ordered in ED Medications  fentaNYL (SUBLIMAZE) injection 50 mcg (50 mcg Intravenous Not Given 11/21/15 1007)  aspirin tablet 325 mg (325 mg Oral Given 11/21/15 1006)  iopamidol (ISOVUE-370) 76 % injection 100 mL (100 mLs Intravenous Contrast Given 11/21/15 1446)    Initial Impression / Assessment and Plan / ED Course  I have reviewed the triage vital signs and the nursing notes.  Pertinent labs & imaging results that were available during my care of the patient  were reviewed by me and considered in my medical decision making (see chart for details).  Clinical Course   65 year old female who presents with right sided chest pain. In no acute distress, but appearing very anxious. Vitals signs non-concerning in ED. Pain is not reproducible. Seems atypical for ACS, but with risk factors and heart score of 3. Serial troponin negative and EKG non-ischemic and without dynamic changes. Chest x-ray visualized and shows no acute cardiopulmonary processes. D-dimer is negative, and she is low risk for PE. Remainder of her blood work is very reassuring. Receive aspirin and some fentanyl for some pain, and states overall her symptoms are improving, but continues to express that her pain seems like something is wrong inside the right side of her chest, back and upper abdomen (although abdominal exam is benign). At this time, we will perform CTA chest/abd/pelvis. This is visualized, and Does not reveal any acute processes within the  chest, abdomen or pelvis. She feels improved, and at this time I think she is stable for discharge home Strict return and follow-up instructions reviewed. She expressed understanding of all discharge instructions and felt comfortable with the plan of care.   Final Clinical Impressions(s) / ED Diagnoses   Final diagnoses:  Nonspecific chest pain   New Prescriptions New Prescriptions   No medications on file   I personally performed the services described in this documentation, which was scribed in my presence. The recorded information has been reviewed and is accurate.    Forde Dandy, MD 11/21/15 [phone]

## 2015-11-21 NOTE — Discharge Instructions (Signed)
Your work-up today is very reassuring.  Please return without fail for worsening symptoms, including worsening pain, difficulty breathing, passing out or any other symptoms concerning to you.

## 2015-11-21 NOTE — ED Notes (Signed)
cbg of 81.

## 2015-12-02 DIAGNOSIS — R300 Dysuria: Secondary | ICD-10-CM | POA: Diagnosis not present

## 2015-12-02 DIAGNOSIS — Z6830 Body mass index (BMI) 30.0-30.9, adult: Secondary | ICD-10-CM | POA: Diagnosis not present

## 2016-02-21 DIAGNOSIS — Z86010 Personal history of colonic polyps: Secondary | ICD-10-CM | POA: Diagnosis not present

## 2016-02-21 DIAGNOSIS — E1143 Type 2 diabetes mellitus with diabetic autonomic (poly)neuropathy: Secondary | ICD-10-CM | POA: Diagnosis not present

## 2016-02-21 DIAGNOSIS — E559 Vitamin D deficiency, unspecified: Secondary | ICD-10-CM | POA: Diagnosis not present

## 2016-02-21 DIAGNOSIS — M797 Fibromyalgia: Secondary | ICD-10-CM | POA: Diagnosis not present

## 2016-02-21 DIAGNOSIS — E782 Mixed hyperlipidemia: Secondary | ICD-10-CM | POA: Diagnosis not present

## 2016-02-23 DIAGNOSIS — E1143 Type 2 diabetes mellitus with diabetic autonomic (poly)neuropathy: Secondary | ICD-10-CM | POA: Diagnosis not present

## 2016-02-23 DIAGNOSIS — E559 Vitamin D deficiency, unspecified: Secondary | ICD-10-CM | POA: Diagnosis not present

## 2016-02-23 DIAGNOSIS — Z86010 Personal history of colonic polyps: Secondary | ICD-10-CM | POA: Diagnosis not present

## 2016-02-23 DIAGNOSIS — E782 Mixed hyperlipidemia: Secondary | ICD-10-CM | POA: Diagnosis not present

## 2016-02-23 DIAGNOSIS — Z0000 Encounter for general adult medical examination without abnormal findings: Secondary | ICD-10-CM | POA: Diagnosis not present

## 2016-02-23 DIAGNOSIS — Z23 Encounter for immunization: Secondary | ICD-10-CM | POA: Diagnosis not present

## 2016-02-23 DIAGNOSIS — Z6829 Body mass index (BMI) 29.0-29.9, adult: Secondary | ICD-10-CM | POA: Diagnosis not present

## 2016-08-21 DIAGNOSIS — E1143 Type 2 diabetes mellitus with diabetic autonomic (poly)neuropathy: Secondary | ICD-10-CM | POA: Diagnosis not present

## 2016-08-21 DIAGNOSIS — M797 Fibromyalgia: Secondary | ICD-10-CM | POA: Diagnosis not present

## 2016-08-21 DIAGNOSIS — E782 Mixed hyperlipidemia: Secondary | ICD-10-CM | POA: Diagnosis not present

## 2016-08-21 DIAGNOSIS — Z86010 Personal history of colonic polyps: Secondary | ICD-10-CM | POA: Diagnosis not present

## 2016-08-21 DIAGNOSIS — H4020X0 Unspecified primary angle-closure glaucoma, stage unspecified: Secondary | ICD-10-CM | POA: Diagnosis not present

## 2016-08-23 DIAGNOSIS — E559 Vitamin D deficiency, unspecified: Secondary | ICD-10-CM | POA: Diagnosis not present

## 2016-08-23 DIAGNOSIS — H8110 Benign paroxysmal vertigo, unspecified ear: Secondary | ICD-10-CM | POA: Diagnosis not present

## 2016-08-23 DIAGNOSIS — M797 Fibromyalgia: Secondary | ICD-10-CM | POA: Diagnosis not present

## 2016-08-23 DIAGNOSIS — E1143 Type 2 diabetes mellitus with diabetic autonomic (poly)neuropathy: Secondary | ICD-10-CM | POA: Diagnosis not present

## 2016-08-23 DIAGNOSIS — E782 Mixed hyperlipidemia: Secondary | ICD-10-CM | POA: Diagnosis not present

## 2016-08-23 DIAGNOSIS — Z1389 Encounter for screening for other disorder: Secondary | ICD-10-CM | POA: Diagnosis not present

## 2016-08-23 DIAGNOSIS — Z6828 Body mass index (BMI) 28.0-28.9, adult: Secondary | ICD-10-CM | POA: Diagnosis not present

## 2016-08-28 DIAGNOSIS — E1165 Type 2 diabetes mellitus with hyperglycemia: Secondary | ICD-10-CM | POA: Diagnosis not present

## 2016-08-28 DIAGNOSIS — E7800 Pure hypercholesterolemia, unspecified: Secondary | ICD-10-CM | POA: Diagnosis not present

## 2016-08-28 DIAGNOSIS — R634 Abnormal weight loss: Secondary | ICD-10-CM | POA: Diagnosis not present

## 2016-10-10 DIAGNOSIS — E1165 Type 2 diabetes mellitus with hyperglycemia: Secondary | ICD-10-CM | POA: Diagnosis not present

## 2016-10-10 DIAGNOSIS — E7800 Pure hypercholesterolemia, unspecified: Secondary | ICD-10-CM | POA: Diagnosis not present

## 2016-10-17 DIAGNOSIS — Z1231 Encounter for screening mammogram for malignant neoplasm of breast: Secondary | ICD-10-CM | POA: Diagnosis not present

## 2016-11-14 DIAGNOSIS — E1143 Type 2 diabetes mellitus with diabetic autonomic (poly)neuropathy: Secondary | ICD-10-CM | POA: Diagnosis not present

## 2016-11-14 DIAGNOSIS — E559 Vitamin D deficiency, unspecified: Secondary | ICD-10-CM | POA: Diagnosis not present

## 2016-11-14 DIAGNOSIS — E782 Mixed hyperlipidemia: Secondary | ICD-10-CM | POA: Diagnosis not present

## 2016-11-16 DIAGNOSIS — E782 Mixed hyperlipidemia: Secondary | ICD-10-CM | POA: Diagnosis not present

## 2016-11-16 DIAGNOSIS — E1143 Type 2 diabetes mellitus with diabetic autonomic (poly)neuropathy: Secondary | ICD-10-CM | POA: Diagnosis not present

## 2016-11-16 DIAGNOSIS — E559 Vitamin D deficiency, unspecified: Secondary | ICD-10-CM | POA: Diagnosis not present

## 2016-11-16 DIAGNOSIS — H4089 Other specified glaucoma: Secondary | ICD-10-CM | POA: Diagnosis not present

## 2016-11-16 DIAGNOSIS — Z23 Encounter for immunization: Secondary | ICD-10-CM | POA: Diagnosis not present

## 2016-11-16 DIAGNOSIS — Z6827 Body mass index (BMI) 27.0-27.9, adult: Secondary | ICD-10-CM | POA: Diagnosis not present

## 2017-04-27 DIAGNOSIS — E1143 Type 2 diabetes mellitus with diabetic autonomic (poly)neuropathy: Secondary | ICD-10-CM | POA: Diagnosis not present

## 2017-04-27 DIAGNOSIS — E782 Mixed hyperlipidemia: Secondary | ICD-10-CM | POA: Diagnosis not present

## 2017-04-27 DIAGNOSIS — M797 Fibromyalgia: Secondary | ICD-10-CM | POA: Diagnosis not present

## 2017-05-03 DIAGNOSIS — Z23 Encounter for immunization: Secondary | ICD-10-CM | POA: Diagnosis not present

## 2017-05-03 DIAGNOSIS — E559 Vitamin D deficiency, unspecified: Secondary | ICD-10-CM | POA: Diagnosis not present

## 2017-05-03 DIAGNOSIS — E782 Mixed hyperlipidemia: Secondary | ICD-10-CM | POA: Diagnosis not present

## 2017-05-03 DIAGNOSIS — Z0001 Encounter for general adult medical examination with abnormal findings: Secondary | ICD-10-CM | POA: Diagnosis not present

## 2017-05-03 DIAGNOSIS — E1143 Type 2 diabetes mellitus with diabetic autonomic (poly)neuropathy: Secondary | ICD-10-CM | POA: Diagnosis not present

## 2017-05-03 DIAGNOSIS — M797 Fibromyalgia: Secondary | ICD-10-CM | POA: Diagnosis not present

## 2017-05-03 DIAGNOSIS — Z6830 Body mass index (BMI) 30.0-30.9, adult: Secondary | ICD-10-CM | POA: Diagnosis not present

## 2017-05-03 DIAGNOSIS — H8110 Benign paroxysmal vertigo, unspecified ear: Secondary | ICD-10-CM | POA: Diagnosis not present

## 2017-05-15 DIAGNOSIS — M810 Age-related osteoporosis without current pathological fracture: Secondary | ICD-10-CM | POA: Diagnosis not present

## 2017-05-15 DIAGNOSIS — Z780 Asymptomatic menopausal state: Secondary | ICD-10-CM | POA: Diagnosis not present

## 2017-07-03 IMAGING — CT CT ANGIO CHEST-ABD-PELV FOR DISSECTION W/ AND WO/W CM
2 of 7 series · 14 of 46 positions shown, 16 images · IV contrast (APPLIED)
Comparison: None.

CLINICAL DATA: Chest pain, right upper back pain, possible aortic
dissection

EXAM:
CT ANGIOGRAPHY CHEST, ABDOMEN AND PELVIS
TECHNIQUE: Multidetector CT imaging through the chest, abdomen and pelvis was
performed using the standard protocol during bolus administration of
intravenous contrast. Multiplanar reconstructed images and MIPs were
obtained and reviewed to evaluate the vascular anatomy.
CONTRAST:  100 cc Isovue

[Series 5: axial arterial · axial · arterial · 0.89mm/px · z∈[-637,-49]mm · 11 of 226 slices shown, 13 images]
[im 15/226  soft-tissue]
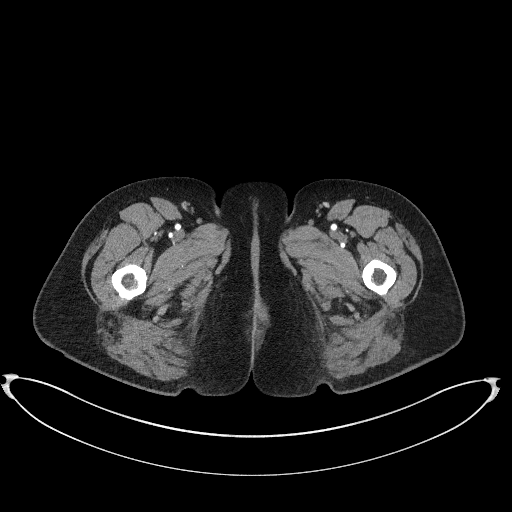
[im 15/226  bone]
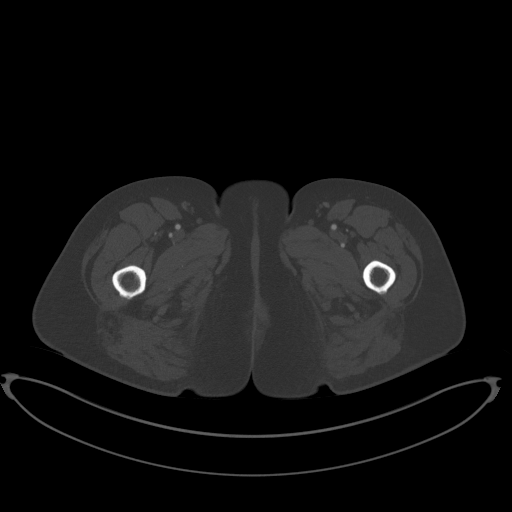
[im 43/226  soft-tissue]
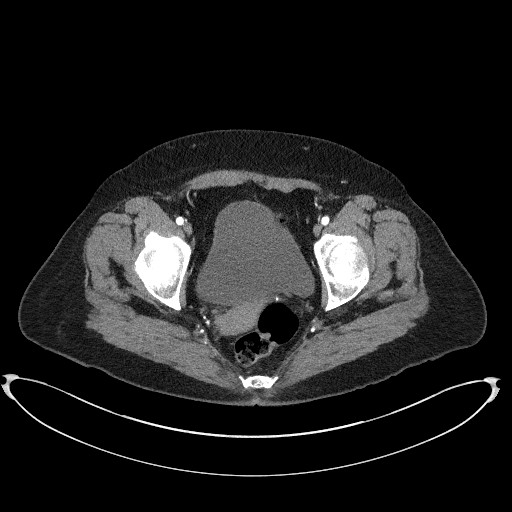
[im 57/226  soft-tissue]
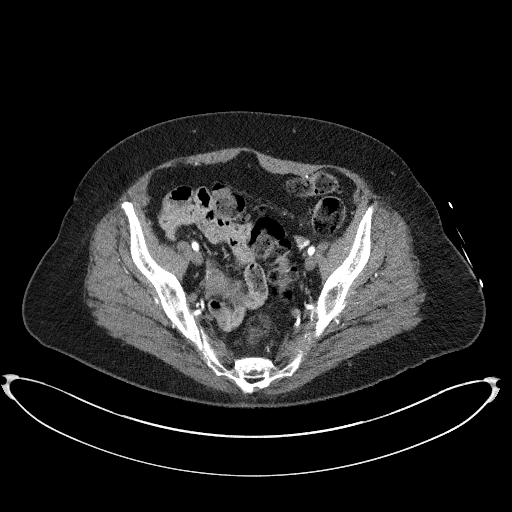
[im 71/226  soft-tissue]
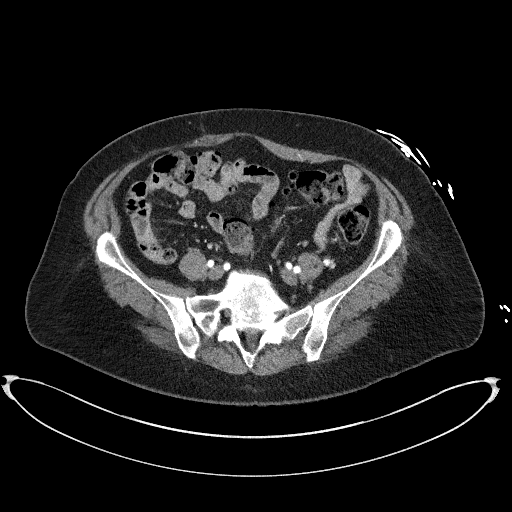
[im 99/226  soft-tissue]
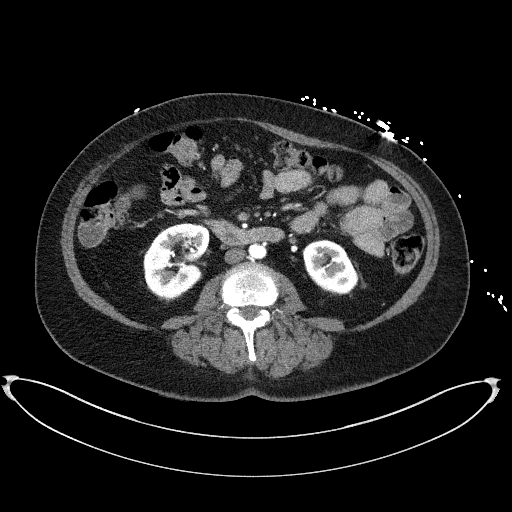
[im 113/226  soft-tissue]
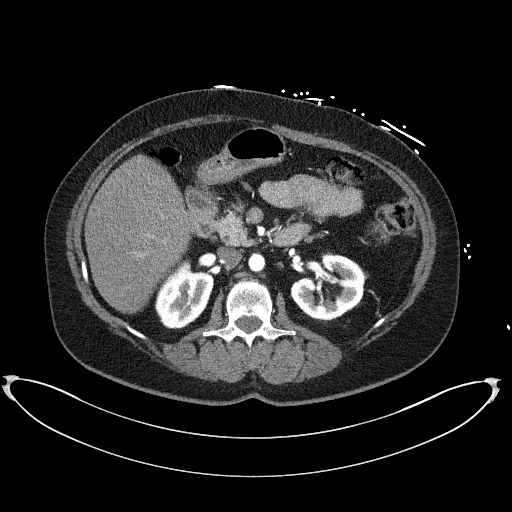
[im 127/226  soft-tissue]
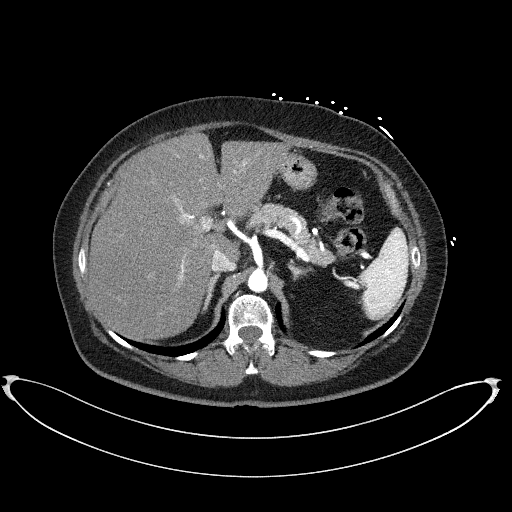
[im 155/226  soft-tissue]
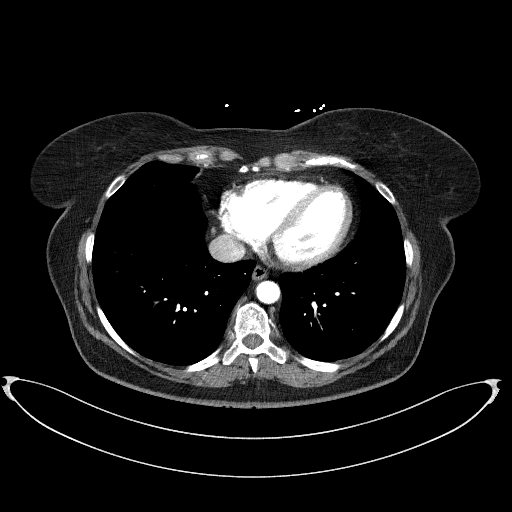
[im 169/226  soft-tissue]
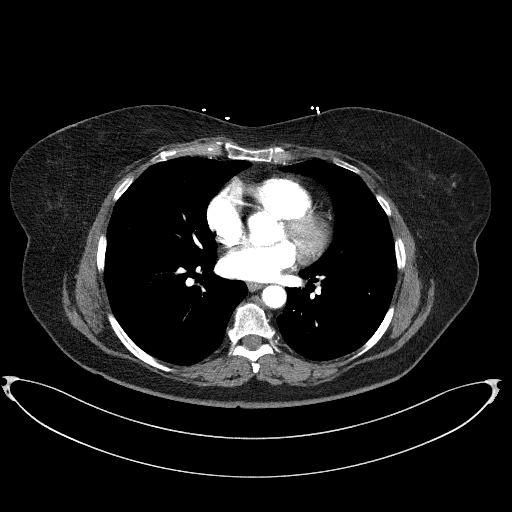
[im 169/226  bone]
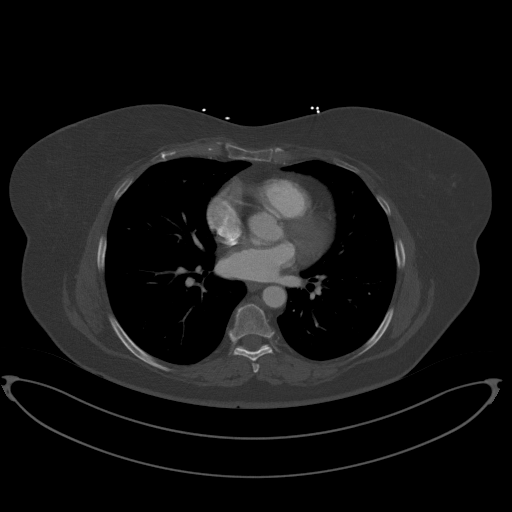
[im 183/226  soft-tissue]
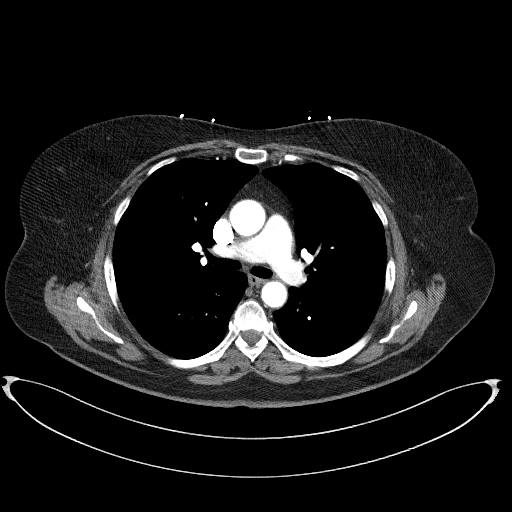
[im 211/226  soft-tissue]
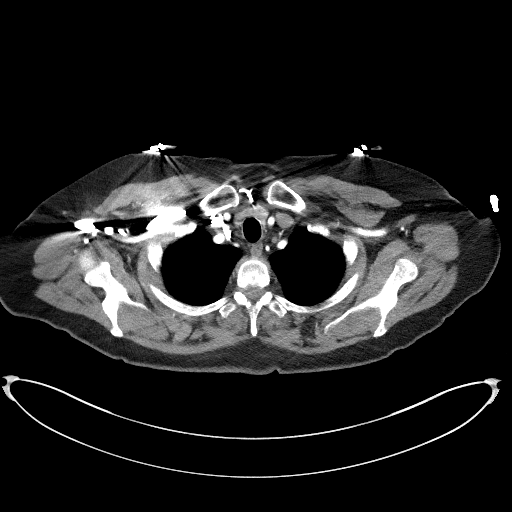

[Series 7: coronals · coronal · 0.79mm/px · 3 of 165 slices shown]
[im 42/165  soft-tissue]
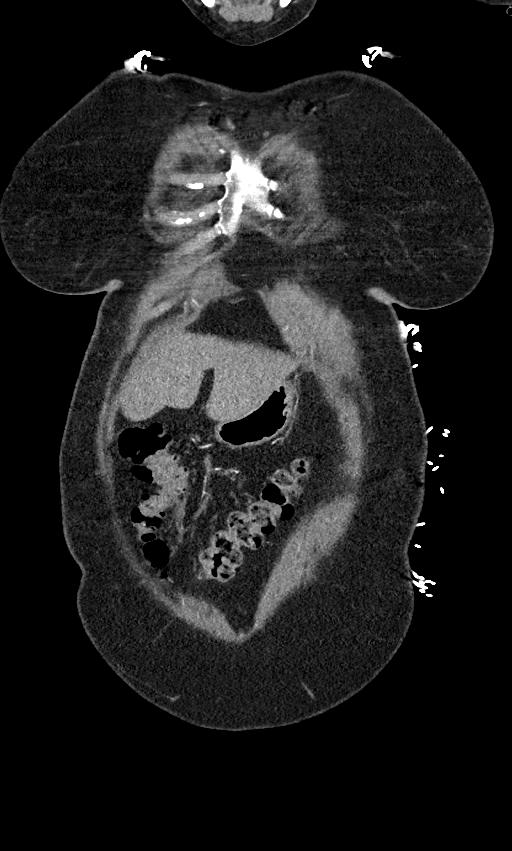
[im 83/165  soft-tissue]
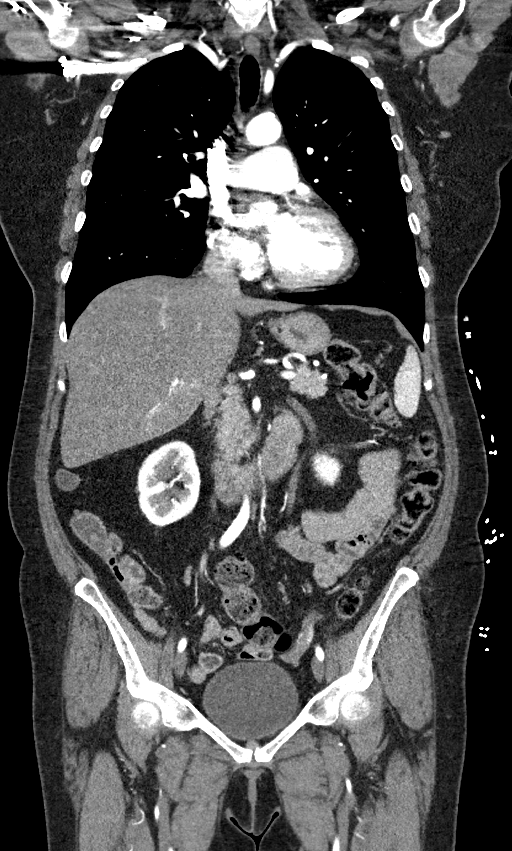
[im 124/165  soft-tissue]
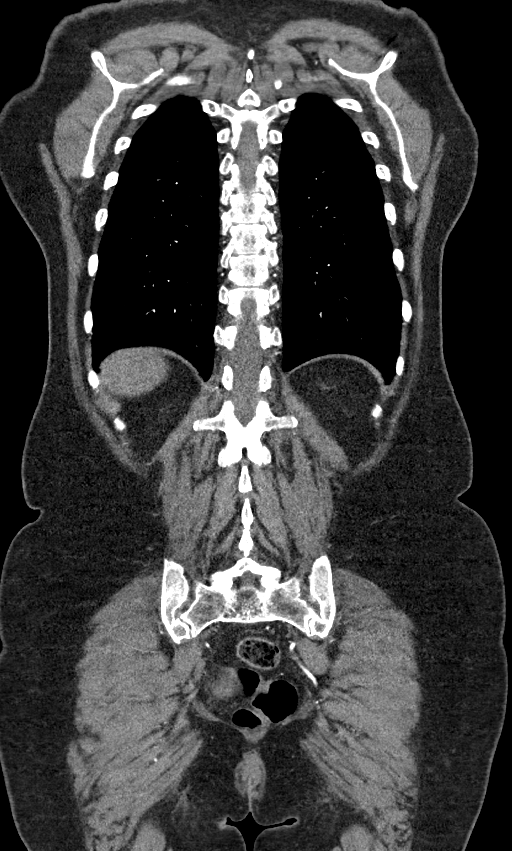

[14 of 46 positions shown; findings below may reference images not displayed]

FINDINGS: CTA CHEST FINDINGS

Cardiovascular: The study is of excellent technical quality. There
is no evidence of aortic aneurysm or aortic dissection. Minimal
atherosclerotic calcifications of aortic knob. The pulmonary artery
is unremarkable. No central pulmonary embolus is noted.

Mediastinum/Nodes: No mediastinal hematoma or adenopathy. No hilar
adenopathy.

Lungs/Pleura: Images of the lung parenchyma shows no acute
infiltrate or pleural effusion. No pulmonary edema. There is no
pneumothorax. No bronchiectasis. No emphysematous changes.

Musculoskeletal: No destructive bony lesions are noted. Sagittal
images of the spine are unremarkable.

Review of the MIP images confirms the above findings.

CTA ABDOMEN AND PELVIS FINDINGS

VASCULAR

Aorta: Abdominal aorta is unremarkable. No aortic dissection. No
aortic aneurysm.

Celiac: Celiac trunk is patent.

SMA: SMA is patent.

Renals: Bilateral renal artery is patent

IMA: Patent

Visualized IVC is patent.

Veins: Bilateral renal vein is patent.

Review of the MIP images confirms the above findings.

NON-VASCULAR

Hepatobiliary: Enhanced liver shows mild fatty infiltration. The
patient is status postcholecystectomy.

Pancreas: Enhanced pancreas is unremarkable.

Spleen: Enhanced spleen is unremarkable.  Splenic artery is patent.

Adrenals/Urinary Tract: Adrenal glands are unremarkable. Kidneys are
normal, without renal calculi, focal lesion, or hydronephrosis.
Bladder is unremarkable.

Stomach/Bowel: Stomach is within normal limits. Status post
appendectomy No evidence of bowel wall thickening, distention, or
inflammatory changes.

Lymphatic: No retroperitoneal or mesenteric adenopathy.

Reproductive: The uterus is anteflexed.  No adnexal mass.

Other: Bilateral iliac arteries are patent. Femoral artery is patent
bilaterally.

Musculoskeletal: No destructive bony lesions are noted. Disc space
flattening with vacuum disc phenomenon at L5-S1 level. Mild
degenerative changes pubic symphysis.

Review of the MIP images confirms the above findings.
IMPRESSION: 1. There is no evidence of aortic aneurysm or aortic dissection.
2. No mediastinal hematoma or adenopathy.
3. Mild fatty infiltration of the liver.
4. No central pulmonary embolus.
5. Patent celiac trunk, SMA, splenic artery and bilateral iliac
arteries.
6. Status post appendectomy.  No small bowel obstruction.

## 2017-07-03 IMAGING — DX DG CHEST 2V
2 series · 2 of 2 positions shown · non-contrast
Comparison: None.

CLINICAL DATA: Right-sided chest pain

EXAM:
CHEST  2 VIEW

[chest pa]
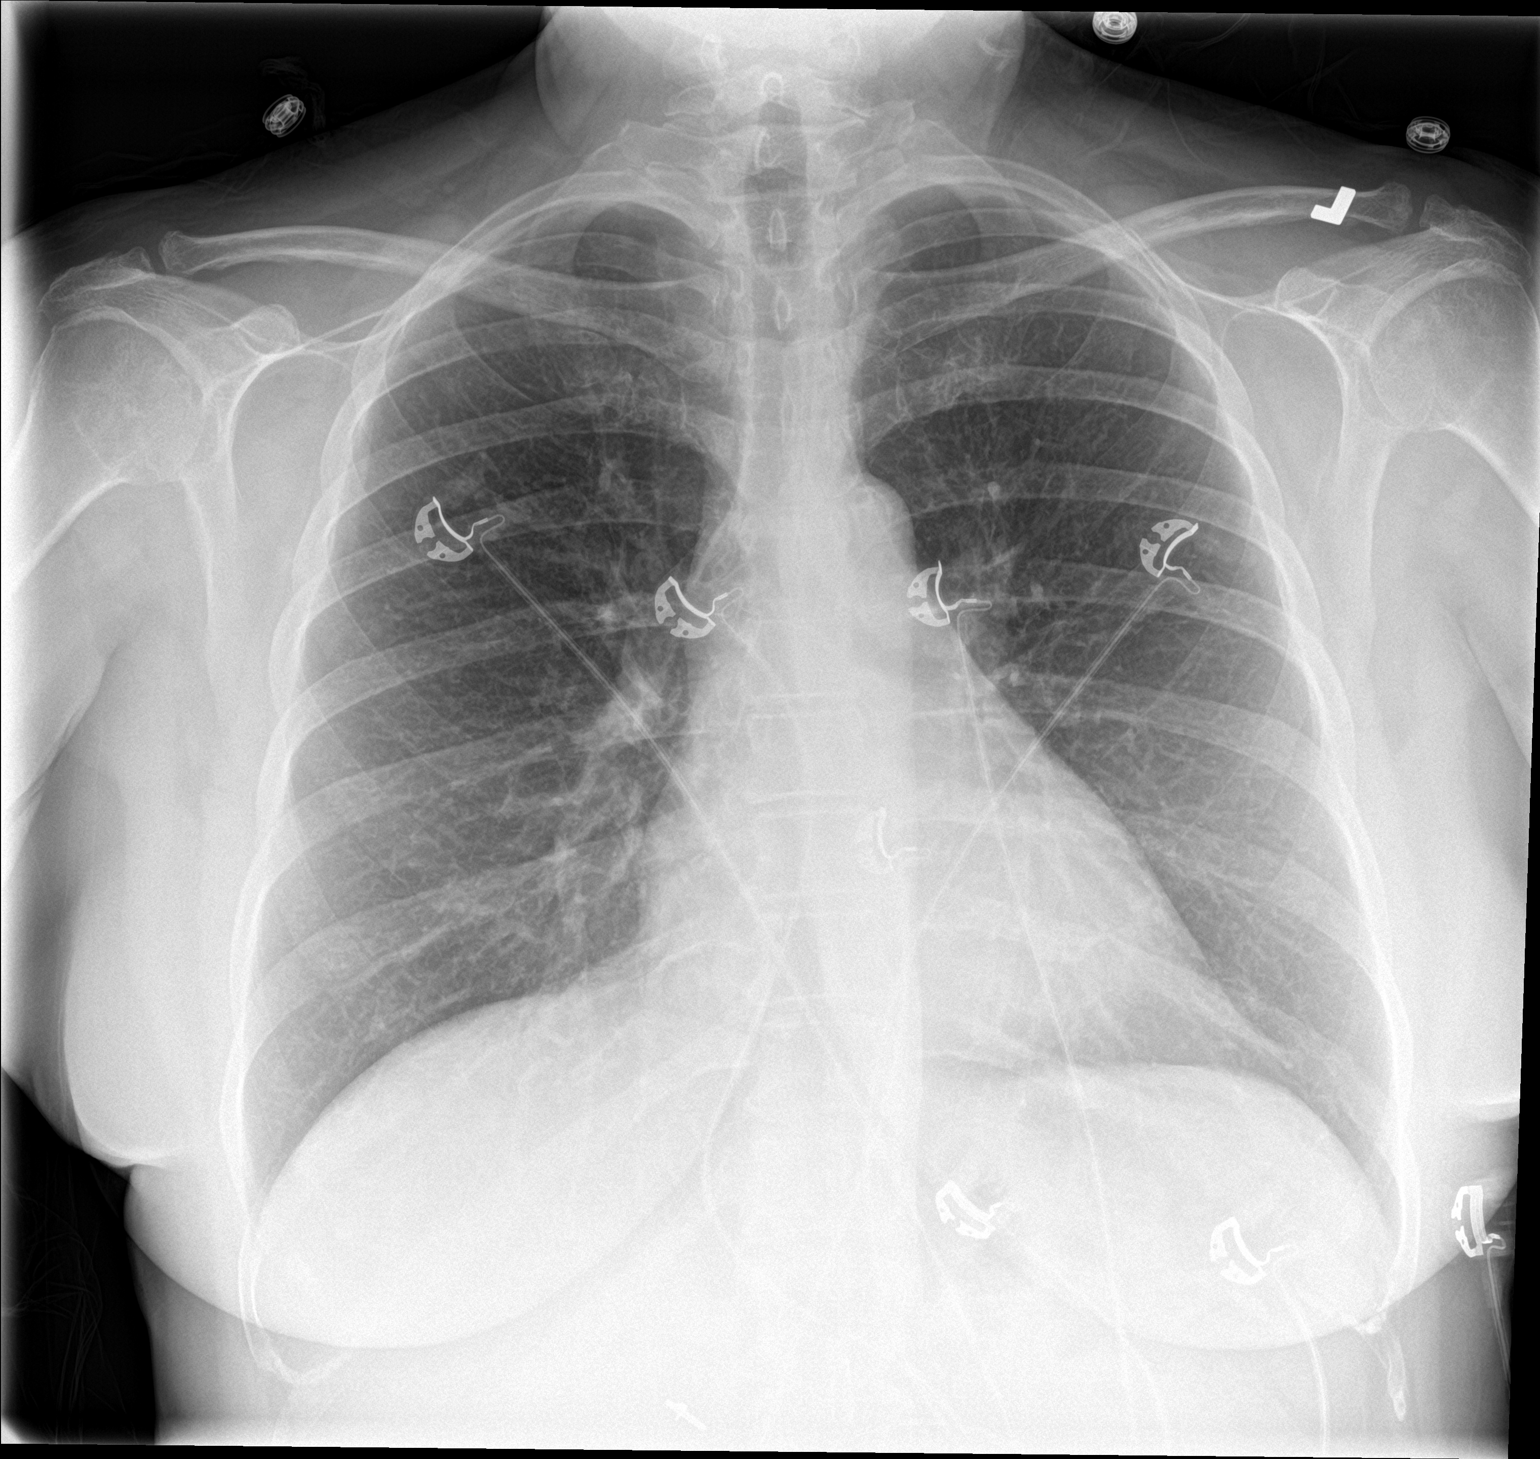

[chest lat]
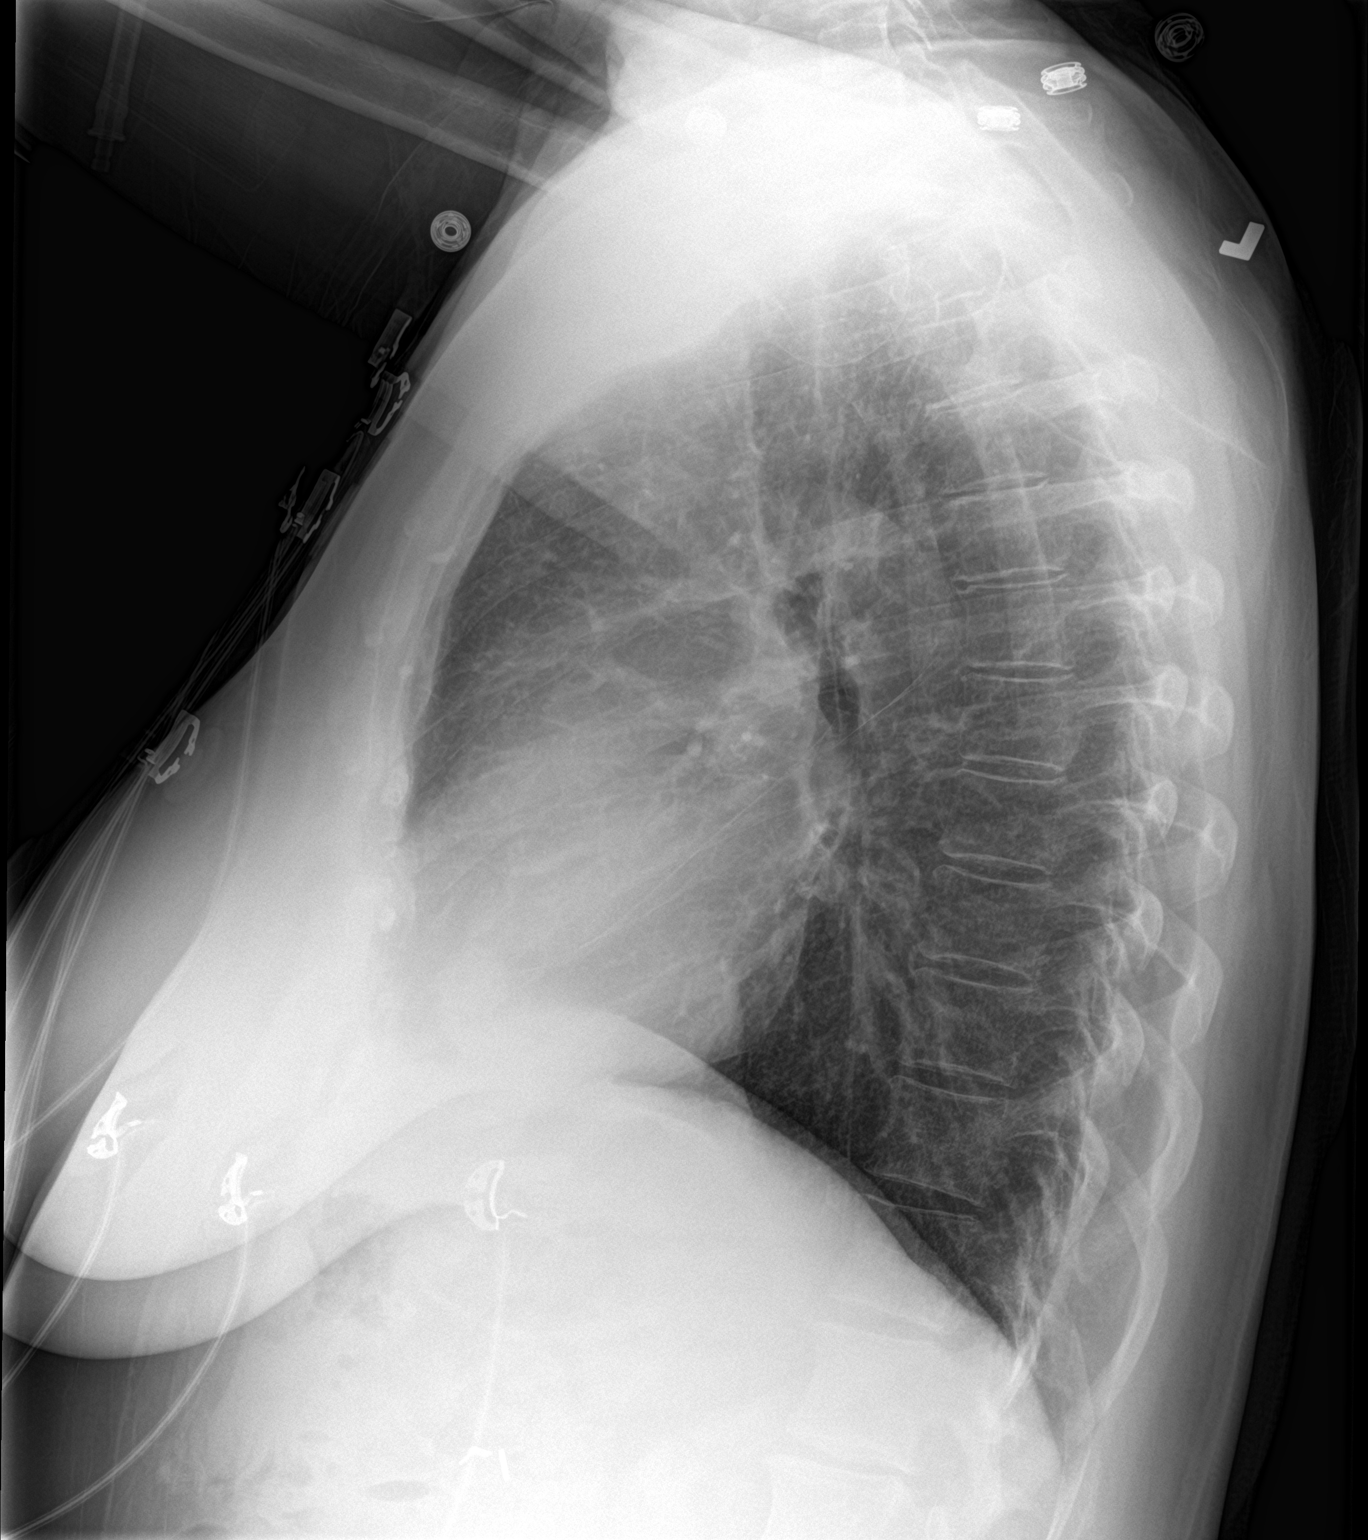

[2 of 2 positions shown; findings below may reference images not displayed]

FINDINGS: The heart size and mediastinal contours are within normal limits.
Both lungs are clear. The visualized skeletal structures are
unremarkable.
IMPRESSION: No active cardiopulmonary disease.

## 2017-08-08 DIAGNOSIS — E782 Mixed hyperlipidemia: Secondary | ICD-10-CM | POA: Diagnosis not present

## 2017-08-08 DIAGNOSIS — E1143 Type 2 diabetes mellitus with diabetic autonomic (poly)neuropathy: Secondary | ICD-10-CM | POA: Diagnosis not present

## 2017-08-08 DIAGNOSIS — M797 Fibromyalgia: Secondary | ICD-10-CM | POA: Diagnosis not present

## 2017-09-24 DIAGNOSIS — E1143 Type 2 diabetes mellitus with diabetic autonomic (poly)neuropathy: Secondary | ICD-10-CM | POA: Diagnosis not present

## 2017-11-05 DIAGNOSIS — E782 Mixed hyperlipidemia: Secondary | ICD-10-CM | POA: Diagnosis not present

## 2017-11-05 DIAGNOSIS — M797 Fibromyalgia: Secondary | ICD-10-CM | POA: Diagnosis not present

## 2017-11-05 DIAGNOSIS — E1143 Type 2 diabetes mellitus with diabetic autonomic (poly)neuropathy: Secondary | ICD-10-CM | POA: Diagnosis not present

## 2017-11-07 DIAGNOSIS — Z86010 Personal history of colonic polyps: Secondary | ICD-10-CM | POA: Diagnosis not present

## 2017-11-07 DIAGNOSIS — E559 Vitamin D deficiency, unspecified: Secondary | ICD-10-CM | POA: Diagnosis not present

## 2017-11-07 DIAGNOSIS — E782 Mixed hyperlipidemia: Secondary | ICD-10-CM | POA: Diagnosis not present

## 2017-11-07 DIAGNOSIS — Z1389 Encounter for screening for other disorder: Secondary | ICD-10-CM | POA: Diagnosis not present

## 2017-11-07 DIAGNOSIS — Z23 Encounter for immunization: Secondary | ICD-10-CM | POA: Diagnosis not present

## 2017-11-07 DIAGNOSIS — E1143 Type 2 diabetes mellitus with diabetic autonomic (poly)neuropathy: Secondary | ICD-10-CM | POA: Diagnosis not present

## 2017-11-07 DIAGNOSIS — Z1331 Encounter for screening for depression: Secondary | ICD-10-CM | POA: Diagnosis not present

## 2018-02-02 DIAGNOSIS — R109 Unspecified abdominal pain: Secondary | ICD-10-CM | POA: Diagnosis not present

## 2018-02-02 DIAGNOSIS — R1013 Epigastric pain: Secondary | ICD-10-CM | POA: Diagnosis not present

## 2018-02-02 DIAGNOSIS — C229 Malignant neoplasm of liver, not specified as primary or secondary: Secondary | ICD-10-CM | POA: Diagnosis not present

## 2018-02-02 DIAGNOSIS — Z7984 Long term (current) use of oral hypoglycemic drugs: Secondary | ICD-10-CM | POA: Diagnosis not present

## 2018-02-02 DIAGNOSIS — E119 Type 2 diabetes mellitus without complications: Secondary | ICD-10-CM | POA: Diagnosis not present

## 2018-02-02 DIAGNOSIS — R160 Hepatomegaly, not elsewhere classified: Secondary | ICD-10-CM | POA: Diagnosis not present

## 2018-02-02 DIAGNOSIS — J449 Chronic obstructive pulmonary disease, unspecified: Secondary | ICD-10-CM | POA: Diagnosis not present

## 2018-02-02 DIAGNOSIS — R630 Anorexia: Secondary | ICD-10-CM | POA: Diagnosis not present

## 2018-02-02 DIAGNOSIS — E785 Hyperlipidemia, unspecified: Secondary | ICD-10-CM | POA: Diagnosis not present

## 2018-02-02 DIAGNOSIS — R1011 Right upper quadrant pain: Secondary | ICD-10-CM | POA: Diagnosis not present

## 2018-02-03 DIAGNOSIS — C229 Malignant neoplasm of liver, not specified as primary or secondary: Secondary | ICD-10-CM | POA: Diagnosis not present

## 2018-02-03 DIAGNOSIS — R160 Hepatomegaly, not elsewhere classified: Secondary | ICD-10-CM | POA: Diagnosis not present

## 2018-02-03 DIAGNOSIS — R1011 Right upper quadrant pain: Secondary | ICD-10-CM | POA: Diagnosis not present

## 2018-02-03 DIAGNOSIS — Z806 Family history of leukemia: Secondary | ICD-10-CM | POA: Diagnosis not present

## 2018-02-03 DIAGNOSIS — K769 Liver disease, unspecified: Secondary | ICD-10-CM | POA: Diagnosis not present

## 2018-02-03 DIAGNOSIS — R109 Unspecified abdominal pain: Secondary | ICD-10-CM | POA: Diagnosis not present

## 2018-02-03 DIAGNOSIS — D1800 Hemangioma unspecified site: Secondary | ICD-10-CM | POA: Diagnosis present

## 2018-02-03 DIAGNOSIS — E785 Hyperlipidemia, unspecified: Secondary | ICD-10-CM | POA: Diagnosis not present

## 2018-02-03 DIAGNOSIS — Z0389 Encounter for observation for other suspected diseases and conditions ruled out: Secondary | ICD-10-CM | POA: Diagnosis not present

## 2018-02-03 DIAGNOSIS — E119 Type 2 diabetes mellitus without complications: Secondary | ICD-10-CM | POA: Diagnosis not present

## 2018-02-03 DIAGNOSIS — Z9049 Acquired absence of other specified parts of digestive tract: Secondary | ICD-10-CM | POA: Diagnosis not present

## 2018-02-03 DIAGNOSIS — R630 Anorexia: Secondary | ICD-10-CM | POA: Diagnosis present

## 2018-02-03 DIAGNOSIS — Z7984 Long term (current) use of oral hypoglycemic drugs: Secondary | ICD-10-CM | POA: Diagnosis not present

## 2018-02-03 DIAGNOSIS — Z809 Family history of malignant neoplasm, unspecified: Secondary | ICD-10-CM | POA: Diagnosis not present

## 2018-02-03 DIAGNOSIS — R748 Abnormal levels of other serum enzymes: Secondary | ICD-10-CM | POA: Diagnosis not present

## 2018-02-07 DIAGNOSIS — R109 Unspecified abdominal pain: Secondary | ICD-10-CM | POA: Diagnosis not present

## 2018-02-11 DIAGNOSIS — C229 Malignant neoplasm of liver, not specified as primary or secondary: Secondary | ICD-10-CM | POA: Diagnosis not present

## 2018-02-11 DIAGNOSIS — C221 Intrahepatic bile duct carcinoma: Secondary | ICD-10-CM | POA: Diagnosis not present

## 2018-02-14 DIAGNOSIS — C221 Intrahepatic bile duct carcinoma: Secondary | ICD-10-CM | POA: Diagnosis not present

## 2018-02-25 DIAGNOSIS — E119 Type 2 diabetes mellitus without complications: Secondary | ICD-10-CM | POA: Diagnosis not present

## 2018-02-25 DIAGNOSIS — Z452 Encounter for adjustment and management of vascular access device: Secondary | ICD-10-CM | POA: Diagnosis not present

## 2018-02-25 DIAGNOSIS — E7800 Pure hypercholesterolemia, unspecified: Secondary | ICD-10-CM | POA: Diagnosis not present

## 2018-02-25 DIAGNOSIS — C221 Intrahepatic bile duct carcinoma: Secondary | ICD-10-CM | POA: Diagnosis not present

## 2018-02-25 DIAGNOSIS — Z7984 Long term (current) use of oral hypoglycemic drugs: Secondary | ICD-10-CM | POA: Diagnosis not present

## 2018-02-26 DIAGNOSIS — R1011 Right upper quadrant pain: Secondary | ICD-10-CM | POA: Diagnosis not present

## 2018-02-26 DIAGNOSIS — C221 Intrahepatic bile duct carcinoma: Secondary | ICD-10-CM | POA: Diagnosis not present

## 2018-02-26 DIAGNOSIS — Z7984 Long term (current) use of oral hypoglycemic drugs: Secondary | ICD-10-CM | POA: Diagnosis not present

## 2018-02-26 DIAGNOSIS — C787 Secondary malignant neoplasm of liver and intrahepatic bile duct: Secondary | ICD-10-CM | POA: Diagnosis not present

## 2018-02-26 DIAGNOSIS — E119 Type 2 diabetes mellitus without complications: Secondary | ICD-10-CM | POA: Diagnosis not present

## 2018-02-26 DIAGNOSIS — E7800 Pure hypercholesterolemia, unspecified: Secondary | ICD-10-CM | POA: Diagnosis not present

## 2018-03-05 DIAGNOSIS — K219 Gastro-esophageal reflux disease without esophagitis: Secondary | ICD-10-CM | POA: Diagnosis not present

## 2018-03-05 DIAGNOSIS — C221 Intrahepatic bile duct carcinoma: Secondary | ICD-10-CM | POA: Diagnosis not present

## 2018-03-05 DIAGNOSIS — E7800 Pure hypercholesterolemia, unspecified: Secondary | ICD-10-CM | POA: Diagnosis not present

## 2018-03-05 DIAGNOSIS — E119 Type 2 diabetes mellitus without complications: Secondary | ICD-10-CM | POA: Diagnosis not present

## 2018-03-05 DIAGNOSIS — R1011 Right upper quadrant pain: Secondary | ICD-10-CM | POA: Diagnosis not present

## 2018-03-05 DIAGNOSIS — F419 Anxiety disorder, unspecified: Secondary | ICD-10-CM | POA: Diagnosis not present

## 2018-03-05 DIAGNOSIS — C787 Secondary malignant neoplasm of liver and intrahepatic bile duct: Secondary | ICD-10-CM | POA: Diagnosis not present

## 2018-03-20 DIAGNOSIS — T451X5A Adverse effect of antineoplastic and immunosuppressive drugs, initial encounter: Secondary | ICD-10-CM | POA: Diagnosis not present

## 2018-03-20 DIAGNOSIS — C787 Secondary malignant neoplasm of liver and intrahepatic bile duct: Secondary | ICD-10-CM | POA: Diagnosis not present

## 2018-03-20 DIAGNOSIS — D701 Agranulocytosis secondary to cancer chemotherapy: Secondary | ICD-10-CM | POA: Diagnosis not present

## 2018-03-20 DIAGNOSIS — R1011 Right upper quadrant pain: Secondary | ICD-10-CM | POA: Diagnosis not present

## 2018-03-20 DIAGNOSIS — D473 Essential (hemorrhagic) thrombocythemia: Secondary | ICD-10-CM | POA: Diagnosis not present

## 2018-03-20 DIAGNOSIS — K219 Gastro-esophageal reflux disease without esophagitis: Secondary | ICD-10-CM | POA: Diagnosis not present

## 2018-03-20 DIAGNOSIS — D6481 Anemia due to antineoplastic chemotherapy: Secondary | ICD-10-CM | POA: Diagnosis not present

## 2018-03-20 DIAGNOSIS — Z7984 Long term (current) use of oral hypoglycemic drugs: Secondary | ICD-10-CM | POA: Diagnosis not present

## 2018-03-20 DIAGNOSIS — F419 Anxiety disorder, unspecified: Secondary | ICD-10-CM | POA: Diagnosis not present

## 2018-03-20 DIAGNOSIS — E119 Type 2 diabetes mellitus without complications: Secondary | ICD-10-CM | POA: Diagnosis not present

## 2018-03-20 DIAGNOSIS — E7800 Pure hypercholesterolemia, unspecified: Secondary | ICD-10-CM | POA: Diagnosis not present

## 2018-03-20 DIAGNOSIS — C221 Intrahepatic bile duct carcinoma: Secondary | ICD-10-CM | POA: Diagnosis not present

## 2018-03-27 DIAGNOSIS — E7800 Pure hypercholesterolemia, unspecified: Secondary | ICD-10-CM | POA: Diagnosis not present

## 2018-03-27 DIAGNOSIS — F419 Anxiety disorder, unspecified: Secondary | ICD-10-CM | POA: Diagnosis not present

## 2018-03-27 DIAGNOSIS — C787 Secondary malignant neoplasm of liver and intrahepatic bile duct: Secondary | ICD-10-CM | POA: Diagnosis not present

## 2018-03-27 DIAGNOSIS — Z7984 Long term (current) use of oral hypoglycemic drugs: Secondary | ICD-10-CM | POA: Diagnosis not present

## 2018-03-27 DIAGNOSIS — E119 Type 2 diabetes mellitus without complications: Secondary | ICD-10-CM | POA: Diagnosis not present

## 2018-03-27 DIAGNOSIS — K219 Gastro-esophageal reflux disease without esophagitis: Secondary | ICD-10-CM | POA: Diagnosis not present

## 2018-04-10 DIAGNOSIS — K219 Gastro-esophageal reflux disease without esophagitis: Secondary | ICD-10-CM | POA: Diagnosis not present

## 2018-04-10 DIAGNOSIS — D473 Essential (hemorrhagic) thrombocythemia: Secondary | ICD-10-CM | POA: Diagnosis not present

## 2018-04-10 DIAGNOSIS — Z7984 Long term (current) use of oral hypoglycemic drugs: Secondary | ICD-10-CM | POA: Diagnosis not present

## 2018-04-10 DIAGNOSIS — E7800 Pure hypercholesterolemia, unspecified: Secondary | ICD-10-CM | POA: Diagnosis not present

## 2018-04-10 DIAGNOSIS — E119 Type 2 diabetes mellitus without complications: Secondary | ICD-10-CM | POA: Diagnosis not present

## 2018-04-10 DIAGNOSIS — D6481 Anemia due to antineoplastic chemotherapy: Secondary | ICD-10-CM | POA: Diagnosis not present

## 2018-04-10 DIAGNOSIS — C787 Secondary malignant neoplasm of liver and intrahepatic bile duct: Secondary | ICD-10-CM | POA: Diagnosis not present

## 2018-04-10 DIAGNOSIS — R1011 Right upper quadrant pain: Secondary | ICD-10-CM | POA: Diagnosis not present

## 2018-04-10 DIAGNOSIS — F419 Anxiety disorder, unspecified: Secondary | ICD-10-CM | POA: Diagnosis not present

## 2018-04-10 DIAGNOSIS — D701 Agranulocytosis secondary to cancer chemotherapy: Secondary | ICD-10-CM | POA: Diagnosis not present

## 2018-04-10 DIAGNOSIS — T451X5A Adverse effect of antineoplastic and immunosuppressive drugs, initial encounter: Secondary | ICD-10-CM | POA: Diagnosis not present

## 2018-04-10 DIAGNOSIS — C221 Intrahepatic bile duct carcinoma: Secondary | ICD-10-CM | POA: Diagnosis not present

## 2018-04-17 DIAGNOSIS — D6481 Anemia due to antineoplastic chemotherapy: Secondary | ICD-10-CM | POA: Diagnosis not present

## 2018-04-17 DIAGNOSIS — D701 Agranulocytosis secondary to cancer chemotherapy: Secondary | ICD-10-CM | POA: Diagnosis not present

## 2018-04-17 DIAGNOSIS — E7800 Pure hypercholesterolemia, unspecified: Secondary | ICD-10-CM | POA: Diagnosis not present

## 2018-04-17 DIAGNOSIS — E119 Type 2 diabetes mellitus without complications: Secondary | ICD-10-CM | POA: Diagnosis not present

## 2018-04-17 DIAGNOSIS — D473 Essential (hemorrhagic) thrombocythemia: Secondary | ICD-10-CM | POA: Diagnosis not present

## 2018-04-17 DIAGNOSIS — C221 Intrahepatic bile duct carcinoma: Secondary | ICD-10-CM | POA: Diagnosis not present

## 2018-04-26 DIAGNOSIS — C787 Secondary malignant neoplasm of liver and intrahepatic bile duct: Secondary | ICD-10-CM | POA: Diagnosis not present

## 2018-04-26 DIAGNOSIS — C221 Intrahepatic bile duct carcinoma: Secondary | ICD-10-CM | POA: Diagnosis not present

## 2018-05-01 DIAGNOSIS — C787 Secondary malignant neoplasm of liver and intrahepatic bile duct: Secondary | ICD-10-CM | POA: Diagnosis not present

## 2018-05-01 DIAGNOSIS — E7800 Pure hypercholesterolemia, unspecified: Secondary | ICD-10-CM | POA: Diagnosis not present

## 2018-05-01 DIAGNOSIS — F419 Anxiety disorder, unspecified: Secondary | ICD-10-CM | POA: Diagnosis not present

## 2018-05-01 DIAGNOSIS — R1011 Right upper quadrant pain: Secondary | ICD-10-CM | POA: Diagnosis not present

## 2018-05-01 DIAGNOSIS — E119 Type 2 diabetes mellitus without complications: Secondary | ICD-10-CM | POA: Diagnosis not present

## 2018-05-01 DIAGNOSIS — C221 Intrahepatic bile duct carcinoma: Secondary | ICD-10-CM | POA: Diagnosis not present

## 2018-05-01 DIAGNOSIS — K219 Gastro-esophageal reflux disease without esophagitis: Secondary | ICD-10-CM | POA: Diagnosis not present

## 2018-05-01 DIAGNOSIS — D473 Essential (hemorrhagic) thrombocythemia: Secondary | ICD-10-CM | POA: Diagnosis not present

## 2018-05-01 DIAGNOSIS — D6481 Anemia due to antineoplastic chemotherapy: Secondary | ICD-10-CM | POA: Diagnosis not present

## 2018-05-08 DIAGNOSIS — E7800 Pure hypercholesterolemia, unspecified: Secondary | ICD-10-CM | POA: Diagnosis not present

## 2018-05-08 DIAGNOSIS — E119 Type 2 diabetes mellitus without complications: Secondary | ICD-10-CM | POA: Diagnosis not present

## 2018-05-08 DIAGNOSIS — C221 Intrahepatic bile duct carcinoma: Secondary | ICD-10-CM | POA: Diagnosis not present

## 2018-05-22 DIAGNOSIS — E119 Type 2 diabetes mellitus without complications: Secondary | ICD-10-CM | POA: Diagnosis not present

## 2018-05-22 DIAGNOSIS — Z7984 Long term (current) use of oral hypoglycemic drugs: Secondary | ICD-10-CM | POA: Diagnosis not present

## 2018-05-22 DIAGNOSIS — R1011 Right upper quadrant pain: Secondary | ICD-10-CM | POA: Diagnosis not present

## 2018-05-22 DIAGNOSIS — E7800 Pure hypercholesterolemia, unspecified: Secondary | ICD-10-CM | POA: Diagnosis not present

## 2018-05-22 DIAGNOSIS — D6481 Anemia due to antineoplastic chemotherapy: Secondary | ICD-10-CM | POA: Diagnosis not present

## 2018-05-22 DIAGNOSIS — C787 Secondary malignant neoplasm of liver and intrahepatic bile duct: Secondary | ICD-10-CM | POA: Diagnosis not present

## 2018-05-22 DIAGNOSIS — C221 Intrahepatic bile duct carcinoma: Secondary | ICD-10-CM | POA: Diagnosis not present

## 2018-05-22 DIAGNOSIS — D473 Essential (hemorrhagic) thrombocythemia: Secondary | ICD-10-CM | POA: Diagnosis not present

## 2018-05-22 DIAGNOSIS — F419 Anxiety disorder, unspecified: Secondary | ICD-10-CM | POA: Diagnosis not present

## 2018-05-22 DIAGNOSIS — K219 Gastro-esophageal reflux disease without esophagitis: Secondary | ICD-10-CM | POA: Diagnosis not present

## 2018-05-29 DIAGNOSIS — E7800 Pure hypercholesterolemia, unspecified: Secondary | ICD-10-CM | POA: Diagnosis not present

## 2018-05-29 DIAGNOSIS — C787 Secondary malignant neoplasm of liver and intrahepatic bile duct: Secondary | ICD-10-CM | POA: Diagnosis not present

## 2018-05-29 DIAGNOSIS — Z7984 Long term (current) use of oral hypoglycemic drugs: Secondary | ICD-10-CM | POA: Diagnosis not present

## 2018-05-29 DIAGNOSIS — E119 Type 2 diabetes mellitus without complications: Secondary | ICD-10-CM | POA: Diagnosis not present

## 2018-05-29 DIAGNOSIS — D6481 Anemia due to antineoplastic chemotherapy: Secondary | ICD-10-CM | POA: Diagnosis not present

## 2018-06-10 DIAGNOSIS — C221 Intrahepatic bile duct carcinoma: Secondary | ICD-10-CM | POA: Diagnosis not present

## 2018-06-10 DIAGNOSIS — Z1379 Encounter for other screening for genetic and chromosomal anomalies: Secondary | ICD-10-CM | POA: Diagnosis not present

## 2018-06-12 DIAGNOSIS — R1011 Right upper quadrant pain: Secondary | ICD-10-CM | POA: Diagnosis not present

## 2018-06-12 DIAGNOSIS — D473 Essential (hemorrhagic) thrombocythemia: Secondary | ICD-10-CM | POA: Diagnosis not present

## 2018-06-12 DIAGNOSIS — Z5111 Encounter for antineoplastic chemotherapy: Secondary | ICD-10-CM | POA: Diagnosis not present

## 2018-06-12 DIAGNOSIS — Z7984 Long term (current) use of oral hypoglycemic drugs: Secondary | ICD-10-CM | POA: Diagnosis not present

## 2018-06-12 DIAGNOSIS — K219 Gastro-esophageal reflux disease without esophagitis: Secondary | ICD-10-CM | POA: Diagnosis not present

## 2018-06-12 DIAGNOSIS — C221 Intrahepatic bile duct carcinoma: Secondary | ICD-10-CM | POA: Diagnosis not present

## 2018-06-12 DIAGNOSIS — C787 Secondary malignant neoplasm of liver and intrahepatic bile duct: Secondary | ICD-10-CM | POA: Diagnosis not present

## 2018-06-12 DIAGNOSIS — D6481 Anemia due to antineoplastic chemotherapy: Secondary | ICD-10-CM | POA: Diagnosis not present

## 2018-06-12 DIAGNOSIS — E7800 Pure hypercholesterolemia, unspecified: Secondary | ICD-10-CM | POA: Diagnosis not present

## 2018-06-12 DIAGNOSIS — F419 Anxiety disorder, unspecified: Secondary | ICD-10-CM | POA: Diagnosis not present

## 2018-06-12 DIAGNOSIS — E119 Type 2 diabetes mellitus without complications: Secondary | ICD-10-CM | POA: Diagnosis not present

## 2018-06-19 DIAGNOSIS — Z5111 Encounter for antineoplastic chemotherapy: Secondary | ICD-10-CM | POA: Diagnosis not present

## 2018-06-19 DIAGNOSIS — D6481 Anemia due to antineoplastic chemotherapy: Secondary | ICD-10-CM | POA: Diagnosis not present

## 2018-06-19 DIAGNOSIS — E7800 Pure hypercholesterolemia, unspecified: Secondary | ICD-10-CM | POA: Diagnosis not present

## 2018-06-19 DIAGNOSIS — C787 Secondary malignant neoplasm of liver and intrahepatic bile duct: Secondary | ICD-10-CM | POA: Diagnosis not present

## 2018-06-19 DIAGNOSIS — E119 Type 2 diabetes mellitus without complications: Secondary | ICD-10-CM | POA: Diagnosis not present

## 2018-06-19 DIAGNOSIS — Z7984 Long term (current) use of oral hypoglycemic drugs: Secondary | ICD-10-CM | POA: Diagnosis not present

## 2018-06-26 DIAGNOSIS — C221 Intrahepatic bile duct carcinoma: Secondary | ICD-10-CM | POA: Diagnosis not present

## 2018-06-26 DIAGNOSIS — Z1379 Encounter for other screening for genetic and chromosomal anomalies: Secondary | ICD-10-CM | POA: Diagnosis not present

## 2018-07-01 DIAGNOSIS — C221 Intrahepatic bile duct carcinoma: Secondary | ICD-10-CM | POA: Diagnosis not present

## 2018-07-01 DIAGNOSIS — C787 Secondary malignant neoplasm of liver and intrahepatic bile duct: Secondary | ICD-10-CM | POA: Diagnosis not present

## 2018-07-01 DIAGNOSIS — R911 Solitary pulmonary nodule: Secondary | ICD-10-CM | POA: Diagnosis not present

## 2018-07-03 DIAGNOSIS — R110 Nausea: Secondary | ICD-10-CM | POA: Diagnosis not present

## 2018-07-03 DIAGNOSIS — C787 Secondary malignant neoplasm of liver and intrahepatic bile duct: Secondary | ICD-10-CM | POA: Diagnosis not present

## 2018-07-03 DIAGNOSIS — D6481 Anemia due to antineoplastic chemotherapy: Secondary | ICD-10-CM | POA: Diagnosis not present

## 2018-07-03 DIAGNOSIS — C221 Intrahepatic bile duct carcinoma: Secondary | ICD-10-CM | POA: Diagnosis not present

## 2018-07-03 DIAGNOSIS — G629 Polyneuropathy, unspecified: Secondary | ICD-10-CM | POA: Diagnosis not present

## 2018-07-03 DIAGNOSIS — R1011 Right upper quadrant pain: Secondary | ICD-10-CM | POA: Diagnosis not present

## 2018-08-15 DIAGNOSIS — R1011 Right upper quadrant pain: Secondary | ICD-10-CM | POA: Diagnosis not present

## 2018-08-15 DIAGNOSIS — C787 Secondary malignant neoplasm of liver and intrahepatic bile duct: Secondary | ICD-10-CM | POA: Diagnosis not present

## 2018-08-15 DIAGNOSIS — R110 Nausea: Secondary | ICD-10-CM | POA: Diagnosis not present

## 2018-08-15 DIAGNOSIS — G629 Polyneuropathy, unspecified: Secondary | ICD-10-CM | POA: Diagnosis not present

## 2018-08-15 DIAGNOSIS — C221 Intrahepatic bile duct carcinoma: Secondary | ICD-10-CM | POA: Diagnosis not present

## 2018-08-15 DIAGNOSIS — D6481 Anemia due to antineoplastic chemotherapy: Secondary | ICD-10-CM | POA: Diagnosis not present

## 2018-08-26 DIAGNOSIS — G629 Polyneuropathy, unspecified: Secondary | ICD-10-CM | POA: Diagnosis not present

## 2018-08-26 DIAGNOSIS — C787 Secondary malignant neoplasm of liver and intrahepatic bile duct: Secondary | ICD-10-CM | POA: Diagnosis not present

## 2018-08-26 DIAGNOSIS — R110 Nausea: Secondary | ICD-10-CM | POA: Diagnosis not present

## 2018-08-26 DIAGNOSIS — C221 Intrahepatic bile duct carcinoma: Secondary | ICD-10-CM | POA: Diagnosis not present

## 2018-08-26 DIAGNOSIS — R1011 Right upper quadrant pain: Secondary | ICD-10-CM | POA: Diagnosis not present

## 2018-08-26 DIAGNOSIS — D6481 Anemia due to antineoplastic chemotherapy: Secondary | ICD-10-CM | POA: Diagnosis not present

## 2018-08-29 ENCOUNTER — Other Ambulatory Visit: Payer: Self-pay

## 2018-09-13 DIAGNOSIS — H04123 Dry eye syndrome of bilateral lacrimal glands: Secondary | ICD-10-CM | POA: Diagnosis not present

## 2018-09-13 DIAGNOSIS — H43813 Vitreous degeneration, bilateral: Secondary | ICD-10-CM | POA: Diagnosis not present

## 2018-09-13 DIAGNOSIS — Z961 Presence of intraocular lens: Secondary | ICD-10-CM | POA: Diagnosis not present

## 2018-09-13 DIAGNOSIS — E119 Type 2 diabetes mellitus without complications: Secondary | ICD-10-CM | POA: Diagnosis not present

## 2018-09-17 DIAGNOSIS — G629 Polyneuropathy, unspecified: Secondary | ICD-10-CM | POA: Diagnosis not present

## 2018-09-17 DIAGNOSIS — C787 Secondary malignant neoplasm of liver and intrahepatic bile duct: Secondary | ICD-10-CM | POA: Diagnosis not present

## 2018-09-17 DIAGNOSIS — R1011 Right upper quadrant pain: Secondary | ICD-10-CM | POA: Diagnosis not present

## 2018-09-17 DIAGNOSIS — R110 Nausea: Secondary | ICD-10-CM | POA: Diagnosis not present

## 2018-09-17 DIAGNOSIS — C221 Intrahepatic bile duct carcinoma: Secondary | ICD-10-CM | POA: Diagnosis not present

## 2018-09-17 DIAGNOSIS — D6481 Anemia due to antineoplastic chemotherapy: Secondary | ICD-10-CM | POA: Diagnosis not present

## 2018-10-04 DIAGNOSIS — R1011 Right upper quadrant pain: Secondary | ICD-10-CM | POA: Diagnosis not present

## 2018-10-04 DIAGNOSIS — C229 Malignant neoplasm of liver, not specified as primary or secondary: Secondary | ICD-10-CM | POA: Diagnosis not present

## 2018-10-04 DIAGNOSIS — K769 Liver disease, unspecified: Secondary | ICD-10-CM | POA: Diagnosis not present

## 2018-10-04 DIAGNOSIS — C221 Intrahepatic bile duct carcinoma: Secondary | ICD-10-CM | POA: Diagnosis not present

## 2018-10-09 DIAGNOSIS — R1011 Right upper quadrant pain: Secondary | ICD-10-CM | POA: Diagnosis not present

## 2018-10-09 DIAGNOSIS — E1140 Type 2 diabetes mellitus with diabetic neuropathy, unspecified: Secondary | ICD-10-CM | POA: Diagnosis not present

## 2018-10-09 DIAGNOSIS — Z7984 Long term (current) use of oral hypoglycemic drugs: Secondary | ICD-10-CM | POA: Diagnosis not present

## 2018-10-09 DIAGNOSIS — E7800 Pure hypercholesterolemia, unspecified: Secondary | ICD-10-CM | POA: Diagnosis not present

## 2018-10-09 DIAGNOSIS — D6481 Anemia due to antineoplastic chemotherapy: Secondary | ICD-10-CM | POA: Diagnosis not present

## 2018-10-09 DIAGNOSIS — G629 Polyneuropathy, unspecified: Secondary | ICD-10-CM | POA: Diagnosis not present

## 2018-10-09 DIAGNOSIS — C787 Secondary malignant neoplasm of liver and intrahepatic bile duct: Secondary | ICD-10-CM | POA: Diagnosis not present

## 2018-10-09 DIAGNOSIS — R110 Nausea: Secondary | ICD-10-CM | POA: Diagnosis not present

## 2018-10-09 DIAGNOSIS — C221 Intrahepatic bile duct carcinoma: Secondary | ICD-10-CM | POA: Diagnosis not present

## 2018-10-30 DIAGNOSIS — C787 Secondary malignant neoplasm of liver and intrahepatic bile duct: Secondary | ICD-10-CM | POA: Diagnosis not present

## 2018-10-30 DIAGNOSIS — C221 Intrahepatic bile duct carcinoma: Secondary | ICD-10-CM | POA: Diagnosis not present

## 2018-10-30 DIAGNOSIS — G629 Polyneuropathy, unspecified: Secondary | ICD-10-CM | POA: Diagnosis not present

## 2018-10-30 DIAGNOSIS — R1011 Right upper quadrant pain: Secondary | ICD-10-CM | POA: Diagnosis not present

## 2018-10-30 DIAGNOSIS — D6481 Anemia due to antineoplastic chemotherapy: Secondary | ICD-10-CM | POA: Diagnosis not present

## 2018-10-30 DIAGNOSIS — R110 Nausea: Secondary | ICD-10-CM | POA: Diagnosis not present

## 2018-11-01 DIAGNOSIS — E782 Mixed hyperlipidemia: Secondary | ICD-10-CM | POA: Diagnosis not present

## 2018-11-01 DIAGNOSIS — H4089 Other specified glaucoma: Secondary | ICD-10-CM | POA: Diagnosis not present

## 2018-11-01 DIAGNOSIS — C229 Malignant neoplasm of liver, not specified as primary or secondary: Secondary | ICD-10-CM | POA: Diagnosis not present

## 2018-11-01 DIAGNOSIS — E1143 Type 2 diabetes mellitus with diabetic autonomic (poly)neuropathy: Secondary | ICD-10-CM | POA: Diagnosis not present

## 2018-11-01 DIAGNOSIS — Z0001 Encounter for general adult medical examination with abnormal findings: Secondary | ICD-10-CM | POA: Diagnosis not present

## 2018-11-19 DIAGNOSIS — D6481 Anemia due to antineoplastic chemotherapy: Secondary | ICD-10-CM | POA: Diagnosis not present

## 2018-11-19 DIAGNOSIS — E119 Type 2 diabetes mellitus without complications: Secondary | ICD-10-CM | POA: Diagnosis not present

## 2018-11-19 DIAGNOSIS — Z7984 Long term (current) use of oral hypoglycemic drugs: Secondary | ICD-10-CM | POA: Diagnosis not present

## 2018-11-19 DIAGNOSIS — R110 Nausea: Secondary | ICD-10-CM | POA: Diagnosis not present

## 2018-11-19 DIAGNOSIS — E7800 Pure hypercholesterolemia, unspecified: Secondary | ICD-10-CM | POA: Diagnosis not present

## 2018-11-19 DIAGNOSIS — Z5111 Encounter for antineoplastic chemotherapy: Secondary | ICD-10-CM | POA: Diagnosis not present

## 2018-11-19 DIAGNOSIS — C221 Intrahepatic bile duct carcinoma: Secondary | ICD-10-CM | POA: Diagnosis not present

## 2018-11-19 DIAGNOSIS — C787 Secondary malignant neoplasm of liver and intrahepatic bile duct: Secondary | ICD-10-CM | POA: Diagnosis not present

## 2018-11-19 DIAGNOSIS — R1011 Right upper quadrant pain: Secondary | ICD-10-CM | POA: Diagnosis not present

## 2018-11-19 DIAGNOSIS — G629 Polyneuropathy, unspecified: Secondary | ICD-10-CM | POA: Diagnosis not present

## 2018-12-12 DIAGNOSIS — R110 Nausea: Secondary | ICD-10-CM | POA: Diagnosis not present

## 2018-12-12 DIAGNOSIS — D6481 Anemia due to antineoplastic chemotherapy: Secondary | ICD-10-CM | POA: Diagnosis not present

## 2018-12-12 DIAGNOSIS — C787 Secondary malignant neoplasm of liver and intrahepatic bile duct: Secondary | ICD-10-CM | POA: Diagnosis not present

## 2018-12-12 DIAGNOSIS — R1011 Right upper quadrant pain: Secondary | ICD-10-CM | POA: Diagnosis not present

## 2018-12-12 DIAGNOSIS — C221 Intrahepatic bile duct carcinoma: Secondary | ICD-10-CM | POA: Diagnosis not present

## 2018-12-12 DIAGNOSIS — G629 Polyneuropathy, unspecified: Secondary | ICD-10-CM | POA: Diagnosis not present

## 2019-01-02 DIAGNOSIS — Z7984 Long term (current) use of oral hypoglycemic drugs: Secondary | ICD-10-CM | POA: Diagnosis not present

## 2019-01-02 DIAGNOSIS — E7800 Pure hypercholesterolemia, unspecified: Secondary | ICD-10-CM | POA: Diagnosis not present

## 2019-01-02 DIAGNOSIS — R1011 Right upper quadrant pain: Secondary | ICD-10-CM | POA: Diagnosis not present

## 2019-01-02 DIAGNOSIS — D6481 Anemia due to antineoplastic chemotherapy: Secondary | ICD-10-CM | POA: Diagnosis not present

## 2019-01-02 DIAGNOSIS — C221 Intrahepatic bile duct carcinoma: Secondary | ICD-10-CM | POA: Diagnosis not present

## 2019-01-02 DIAGNOSIS — E119 Type 2 diabetes mellitus without complications: Secondary | ICD-10-CM | POA: Diagnosis not present

## 2019-01-02 DIAGNOSIS — G629 Polyneuropathy, unspecified: Secondary | ICD-10-CM | POA: Diagnosis not present

## 2019-01-02 DIAGNOSIS — R110 Nausea: Secondary | ICD-10-CM | POA: Diagnosis not present

## 2019-01-02 DIAGNOSIS — C787 Secondary malignant neoplasm of liver and intrahepatic bile duct: Secondary | ICD-10-CM | POA: Diagnosis not present

## 2019-02-18 DIAGNOSIS — D6481 Anemia due to antineoplastic chemotherapy: Secondary | ICD-10-CM | POA: Diagnosis not present

## 2019-02-18 DIAGNOSIS — E7800 Pure hypercholesterolemia, unspecified: Secondary | ICD-10-CM | POA: Diagnosis not present

## 2019-02-18 DIAGNOSIS — R110 Nausea: Secondary | ICD-10-CM | POA: Diagnosis not present

## 2019-02-18 DIAGNOSIS — C221 Intrahepatic bile duct carcinoma: Secondary | ICD-10-CM | POA: Diagnosis not present

## 2019-02-18 DIAGNOSIS — Z7984 Long term (current) use of oral hypoglycemic drugs: Secondary | ICD-10-CM | POA: Diagnosis not present

## 2019-02-18 DIAGNOSIS — R1011 Right upper quadrant pain: Secondary | ICD-10-CM | POA: Diagnosis not present

## 2019-02-18 DIAGNOSIS — E119 Type 2 diabetes mellitus without complications: Secondary | ICD-10-CM | POA: Diagnosis not present

## 2019-02-18 DIAGNOSIS — G629 Polyneuropathy, unspecified: Secondary | ICD-10-CM | POA: Diagnosis not present

## 2019-02-18 DIAGNOSIS — C787 Secondary malignant neoplasm of liver and intrahepatic bile duct: Secondary | ICD-10-CM | POA: Diagnosis not present

## 2019-02-20 DIAGNOSIS — C787 Secondary malignant neoplasm of liver and intrahepatic bile duct: Secondary | ICD-10-CM | POA: Diagnosis not present

## 2019-02-20 DIAGNOSIS — D6481 Anemia due to antineoplastic chemotherapy: Secondary | ICD-10-CM | POA: Diagnosis not present

## 2019-02-20 DIAGNOSIS — E7800 Pure hypercholesterolemia, unspecified: Secondary | ICD-10-CM | POA: Diagnosis not present

## 2019-02-20 DIAGNOSIS — E119 Type 2 diabetes mellitus without complications: Secondary | ICD-10-CM | POA: Diagnosis not present

## 2019-02-20 DIAGNOSIS — G629 Polyneuropathy, unspecified: Secondary | ICD-10-CM | POA: Diagnosis not present

## 2019-03-04 DIAGNOSIS — E1140 Type 2 diabetes mellitus with diabetic neuropathy, unspecified: Secondary | ICD-10-CM | POA: Diagnosis not present

## 2019-03-04 DIAGNOSIS — D6481 Anemia due to antineoplastic chemotherapy: Secondary | ICD-10-CM | POA: Diagnosis not present

## 2019-03-04 DIAGNOSIS — Z7984 Long term (current) use of oral hypoglycemic drugs: Secondary | ICD-10-CM | POA: Diagnosis not present

## 2019-03-04 DIAGNOSIS — C787 Secondary malignant neoplasm of liver and intrahepatic bile duct: Secondary | ICD-10-CM | POA: Diagnosis not present

## 2019-03-04 DIAGNOSIS — C221 Intrahepatic bile duct carcinoma: Secondary | ICD-10-CM | POA: Diagnosis not present

## 2019-03-04 DIAGNOSIS — E7800 Pure hypercholesterolemia, unspecified: Secondary | ICD-10-CM | POA: Diagnosis not present

## 2019-03-04 DIAGNOSIS — Z5111 Encounter for antineoplastic chemotherapy: Secondary | ICD-10-CM | POA: Diagnosis not present

## 2019-03-04 DIAGNOSIS — G629 Polyneuropathy, unspecified: Secondary | ICD-10-CM | POA: Diagnosis not present

## 2019-03-04 DIAGNOSIS — R110 Nausea: Secondary | ICD-10-CM | POA: Diagnosis not present

## 2019-03-04 DIAGNOSIS — R1011 Right upper quadrant pain: Secondary | ICD-10-CM | POA: Diagnosis not present

## 2019-03-06 DIAGNOSIS — D701 Agranulocytosis secondary to cancer chemotherapy: Secondary | ICD-10-CM | POA: Diagnosis not present

## 2019-03-06 DIAGNOSIS — Z5111 Encounter for antineoplastic chemotherapy: Secondary | ICD-10-CM | POA: Diagnosis not present

## 2019-03-06 DIAGNOSIS — E1140 Type 2 diabetes mellitus with diabetic neuropathy, unspecified: Secondary | ICD-10-CM | POA: Diagnosis not present

## 2019-03-06 DIAGNOSIS — C221 Intrahepatic bile duct carcinoma: Secondary | ICD-10-CM | POA: Diagnosis not present

## 2019-03-06 DIAGNOSIS — D6481 Anemia due to antineoplastic chemotherapy: Secondary | ICD-10-CM | POA: Diagnosis not present

## 2019-03-06 DIAGNOSIS — E7800 Pure hypercholesterolemia, unspecified: Secondary | ICD-10-CM | POA: Diagnosis not present

## 2019-03-18 DIAGNOSIS — G629 Polyneuropathy, unspecified: Secondary | ICD-10-CM | POA: Diagnosis not present

## 2019-03-18 DIAGNOSIS — C221 Intrahepatic bile duct carcinoma: Secondary | ICD-10-CM | POA: Diagnosis not present

## 2019-03-18 DIAGNOSIS — R1011 Right upper quadrant pain: Secondary | ICD-10-CM | POA: Diagnosis not present

## 2019-03-18 DIAGNOSIS — E119 Type 2 diabetes mellitus without complications: Secondary | ICD-10-CM | POA: Diagnosis not present

## 2019-03-18 DIAGNOSIS — D62 Acute posthemorrhagic anemia: Secondary | ICD-10-CM | POA: Diagnosis not present

## 2019-03-18 DIAGNOSIS — D6481 Anemia due to antineoplastic chemotherapy: Secondary | ICD-10-CM | POA: Diagnosis not present

## 2019-03-18 DIAGNOSIS — R110 Nausea: Secondary | ICD-10-CM | POA: Diagnosis not present

## 2019-03-18 DIAGNOSIS — E7800 Pure hypercholesterolemia, unspecified: Secondary | ICD-10-CM | POA: Diagnosis not present

## 2019-03-18 DIAGNOSIS — C787 Secondary malignant neoplasm of liver and intrahepatic bile duct: Secondary | ICD-10-CM | POA: Diagnosis not present

## 2019-03-20 DIAGNOSIS — E119 Type 2 diabetes mellitus without complications: Secondary | ICD-10-CM | POA: Diagnosis not present

## 2019-03-20 DIAGNOSIS — D62 Acute posthemorrhagic anemia: Secondary | ICD-10-CM | POA: Diagnosis not present

## 2019-03-20 DIAGNOSIS — D6481 Anemia due to antineoplastic chemotherapy: Secondary | ICD-10-CM | POA: Diagnosis not present

## 2019-03-20 DIAGNOSIS — E7800 Pure hypercholesterolemia, unspecified: Secondary | ICD-10-CM | POA: Diagnosis not present

## 2019-03-20 DIAGNOSIS — C221 Intrahepatic bile duct carcinoma: Secondary | ICD-10-CM | POA: Diagnosis not present

## 2019-04-01 DIAGNOSIS — C787 Secondary malignant neoplasm of liver and intrahepatic bile duct: Secondary | ICD-10-CM | POA: Diagnosis not present

## 2019-04-01 DIAGNOSIS — R110 Nausea: Secondary | ICD-10-CM | POA: Diagnosis not present

## 2019-04-01 DIAGNOSIS — E7800 Pure hypercholesterolemia, unspecified: Secondary | ICD-10-CM | POA: Diagnosis not present

## 2019-04-01 DIAGNOSIS — G629 Polyneuropathy, unspecified: Secondary | ICD-10-CM | POA: Diagnosis not present

## 2019-04-01 DIAGNOSIS — E119 Type 2 diabetes mellitus without complications: Secondary | ICD-10-CM | POA: Diagnosis not present

## 2019-04-01 DIAGNOSIS — R1011 Right upper quadrant pain: Secondary | ICD-10-CM | POA: Diagnosis not present

## 2019-04-01 DIAGNOSIS — C221 Intrahepatic bile duct carcinoma: Secondary | ICD-10-CM | POA: Diagnosis not present

## 2019-04-01 DIAGNOSIS — D6481 Anemia due to antineoplastic chemotherapy: Secondary | ICD-10-CM | POA: Diagnosis not present

## 2019-04-03 DIAGNOSIS — D701 Agranulocytosis secondary to cancer chemotherapy: Secondary | ICD-10-CM | POA: Diagnosis not present

## 2019-04-03 DIAGNOSIS — C221 Intrahepatic bile duct carcinoma: Secondary | ICD-10-CM | POA: Diagnosis not present

## 2019-04-15 DIAGNOSIS — C221 Intrahepatic bile duct carcinoma: Secondary | ICD-10-CM | POA: Diagnosis not present

## 2019-04-15 DIAGNOSIS — Z7984 Long term (current) use of oral hypoglycemic drugs: Secondary | ICD-10-CM | POA: Diagnosis not present

## 2019-04-15 DIAGNOSIS — D6481 Anemia due to antineoplastic chemotherapy: Secondary | ICD-10-CM | POA: Diagnosis not present

## 2019-04-15 DIAGNOSIS — E1140 Type 2 diabetes mellitus with diabetic neuropathy, unspecified: Secondary | ICD-10-CM | POA: Diagnosis not present

## 2019-04-15 DIAGNOSIS — E7800 Pure hypercholesterolemia, unspecified: Secondary | ICD-10-CM | POA: Diagnosis not present

## 2019-04-15 DIAGNOSIS — R1011 Right upper quadrant pain: Secondary | ICD-10-CM | POA: Diagnosis not present

## 2019-04-15 DIAGNOSIS — G629 Polyneuropathy, unspecified: Secondary | ICD-10-CM | POA: Diagnosis not present

## 2019-04-15 DIAGNOSIS — C787 Secondary malignant neoplasm of liver and intrahepatic bile duct: Secondary | ICD-10-CM | POA: Diagnosis not present

## 2019-04-17 DIAGNOSIS — D6481 Anemia due to antineoplastic chemotherapy: Secondary | ICD-10-CM | POA: Diagnosis not present

## 2019-04-17 DIAGNOSIS — Z7984 Long term (current) use of oral hypoglycemic drugs: Secondary | ICD-10-CM | POA: Diagnosis not present

## 2019-04-17 DIAGNOSIS — C787 Secondary malignant neoplasm of liver and intrahepatic bile duct: Secondary | ICD-10-CM | POA: Diagnosis not present

## 2019-04-17 DIAGNOSIS — E1140 Type 2 diabetes mellitus with diabetic neuropathy, unspecified: Secondary | ICD-10-CM | POA: Diagnosis not present

## 2019-04-17 DIAGNOSIS — E7800 Pure hypercholesterolemia, unspecified: Secondary | ICD-10-CM | POA: Diagnosis not present

## 2019-04-25 DIAGNOSIS — E1143 Type 2 diabetes mellitus with diabetic autonomic (poly)neuropathy: Secondary | ICD-10-CM | POA: Diagnosis not present

## 2019-04-29 DIAGNOSIS — E7800 Pure hypercholesterolemia, unspecified: Secondary | ICD-10-CM | POA: Diagnosis not present

## 2019-04-29 DIAGNOSIS — G629 Polyneuropathy, unspecified: Secondary | ICD-10-CM | POA: Diagnosis not present

## 2019-04-29 DIAGNOSIS — C787 Secondary malignant neoplasm of liver and intrahepatic bile duct: Secondary | ICD-10-CM | POA: Diagnosis not present

## 2019-04-29 DIAGNOSIS — E119 Type 2 diabetes mellitus without complications: Secondary | ICD-10-CM | POA: Diagnosis not present

## 2019-04-29 DIAGNOSIS — D6481 Anemia due to antineoplastic chemotherapy: Secondary | ICD-10-CM | POA: Diagnosis not present

## 2019-04-29 DIAGNOSIS — C221 Intrahepatic bile duct carcinoma: Secondary | ICD-10-CM | POA: Diagnosis not present

## 2019-04-29 DIAGNOSIS — R978 Other abnormal tumor markers: Secondary | ICD-10-CM | POA: Diagnosis not present

## 2019-04-29 DIAGNOSIS — C801 Malignant (primary) neoplasm, unspecified: Secondary | ICD-10-CM | POA: Diagnosis not present

## 2019-05-06 DIAGNOSIS — C787 Secondary malignant neoplasm of liver and intrahepatic bile duct: Secondary | ICD-10-CM | POA: Diagnosis not present

## 2019-05-06 DIAGNOSIS — D6481 Anemia due to antineoplastic chemotherapy: Secondary | ICD-10-CM | POA: Diagnosis not present

## 2019-05-06 DIAGNOSIS — R1011 Right upper quadrant pain: Secondary | ICD-10-CM | POA: Diagnosis not present

## 2019-05-06 DIAGNOSIS — E119 Type 2 diabetes mellitus without complications: Secondary | ICD-10-CM | POA: Diagnosis not present

## 2019-05-06 DIAGNOSIS — G629 Polyneuropathy, unspecified: Secondary | ICD-10-CM | POA: Diagnosis not present

## 2019-05-06 DIAGNOSIS — C221 Intrahepatic bile duct carcinoma: Secondary | ICD-10-CM | POA: Diagnosis not present

## 2019-05-06 DIAGNOSIS — Z5111 Encounter for antineoplastic chemotherapy: Secondary | ICD-10-CM | POA: Diagnosis not present

## 2019-05-06 DIAGNOSIS — E7800 Pure hypercholesterolemia, unspecified: Secondary | ICD-10-CM | POA: Diagnosis not present

## 2019-05-07 DIAGNOSIS — R846 Abnormal cytological findings in specimens from respiratory organs and thorax: Secondary | ICD-10-CM | POA: Diagnosis not present

## 2019-05-07 DIAGNOSIS — Z8505 Personal history of malignant neoplasm of liver: Secondary | ICD-10-CM | POA: Diagnosis not present

## 2019-05-08 DIAGNOSIS — E119 Type 2 diabetes mellitus without complications: Secondary | ICD-10-CM | POA: Diagnosis not present

## 2019-05-08 DIAGNOSIS — Z5111 Encounter for antineoplastic chemotherapy: Secondary | ICD-10-CM | POA: Diagnosis not present

## 2019-05-08 DIAGNOSIS — C221 Intrahepatic bile duct carcinoma: Secondary | ICD-10-CM | POA: Diagnosis not present

## 2019-05-08 DIAGNOSIS — E7800 Pure hypercholesterolemia, unspecified: Secondary | ICD-10-CM | POA: Diagnosis not present

## 2019-05-08 DIAGNOSIS — D701 Agranulocytosis secondary to cancer chemotherapy: Secondary | ICD-10-CM | POA: Diagnosis not present

## 2019-05-09 DIAGNOSIS — G629 Polyneuropathy, unspecified: Secondary | ICD-10-CM | POA: Diagnosis not present

## 2019-05-09 DIAGNOSIS — C801 Malignant (primary) neoplasm, unspecified: Secondary | ICD-10-CM | POA: Diagnosis not present

## 2019-05-09 DIAGNOSIS — R188 Other ascites: Secondary | ICD-10-CM | POA: Diagnosis not present

## 2019-05-09 DIAGNOSIS — D6481 Anemia due to antineoplastic chemotherapy: Secondary | ICD-10-CM | POA: Diagnosis not present

## 2019-05-09 DIAGNOSIS — C221 Intrahepatic bile duct carcinoma: Secondary | ICD-10-CM | POA: Diagnosis not present

## 2019-05-09 DIAGNOSIS — R180 Malignant ascites: Secondary | ICD-10-CM | POA: Diagnosis not present

## 2019-05-20 DIAGNOSIS — R180 Malignant ascites: Secondary | ICD-10-CM | POA: Diagnosis not present

## 2019-05-20 DIAGNOSIS — D6481 Anemia due to antineoplastic chemotherapy: Secondary | ICD-10-CM | POA: Diagnosis not present

## 2019-05-20 DIAGNOSIS — C787 Secondary malignant neoplasm of liver and intrahepatic bile duct: Secondary | ICD-10-CM | POA: Diagnosis not present

## 2019-05-20 DIAGNOSIS — R8569 Abnormal cytological findings in specimens from other digestive organs and abdominal cavity: Secondary | ICD-10-CM | POA: Diagnosis not present

## 2019-05-20 DIAGNOSIS — R188 Other ascites: Secondary | ICD-10-CM | POA: Diagnosis not present

## 2019-05-20 DIAGNOSIS — G629 Polyneuropathy, unspecified: Secondary | ICD-10-CM | POA: Diagnosis not present

## 2019-05-20 DIAGNOSIS — C221 Intrahepatic bile duct carcinoma: Secondary | ICD-10-CM | POA: Diagnosis not present

## 2019-05-25 DIAGNOSIS — Z95828 Presence of other vascular implants and grafts: Secondary | ICD-10-CM | POA: Diagnosis not present

## 2019-05-25 DIAGNOSIS — E872 Acidosis: Secondary | ICD-10-CM | POA: Diagnosis not present

## 2019-05-25 DIAGNOSIS — Z66 Do not resuscitate: Secondary | ICD-10-CM | POA: Diagnosis not present

## 2019-05-25 DIAGNOSIS — Z515 Encounter for palliative care: Secondary | ICD-10-CM | POA: Diagnosis not present

## 2019-05-25 DIAGNOSIS — R188 Other ascites: Secondary | ICD-10-CM | POA: Diagnosis not present

## 2019-05-25 DIAGNOSIS — E11649 Type 2 diabetes mellitus with hypoglycemia without coma: Secondary | ICD-10-CM | POA: Diagnosis not present

## 2019-05-25 DIAGNOSIS — E161 Other hypoglycemia: Secondary | ICD-10-CM | POA: Diagnosis not present

## 2019-05-25 DIAGNOSIS — A419 Sepsis, unspecified organism: Secondary | ICD-10-CM | POA: Diagnosis not present

## 2019-05-25 DIAGNOSIS — R410 Disorientation, unspecified: Secondary | ICD-10-CM | POA: Diagnosis not present

## 2019-05-25 DIAGNOSIS — E860 Dehydration: Secondary | ICD-10-CM | POA: Diagnosis not present

## 2019-05-25 DIAGNOSIS — E162 Hypoglycemia, unspecified: Secondary | ICD-10-CM | POA: Diagnosis not present

## 2019-05-25 DIAGNOSIS — R404 Transient alteration of awareness: Secondary | ICD-10-CM | POA: Diagnosis not present

## 2019-05-25 DIAGNOSIS — E43 Unspecified severe protein-calorie malnutrition: Secondary | ICD-10-CM | POA: Diagnosis not present

## 2019-05-25 DIAGNOSIS — J9811 Atelectasis: Secondary | ICD-10-CM | POA: Diagnosis not present

## 2019-05-26 DIAGNOSIS — D72829 Elevated white blood cell count, unspecified: Secondary | ICD-10-CM | POA: Diagnosis not present

## 2019-05-26 DIAGNOSIS — E860 Dehydration: Secondary | ICD-10-CM | POA: Diagnosis present

## 2019-05-26 DIAGNOSIS — E872 Acidosis: Secondary | ICD-10-CM | POA: Diagnosis present

## 2019-05-26 DIAGNOSIS — E11649 Type 2 diabetes mellitus with hypoglycemia without coma: Secondary | ICD-10-CM | POA: Diagnosis present

## 2019-05-26 DIAGNOSIS — D638 Anemia in other chronic diseases classified elsewhere: Secondary | ICD-10-CM | POA: Diagnosis present

## 2019-05-26 DIAGNOSIS — Z9049 Acquired absence of other specified parts of digestive tract: Secondary | ICD-10-CM | POA: Diagnosis not present

## 2019-05-26 DIAGNOSIS — Z6822 Body mass index (BMI) 22.0-22.9, adult: Secondary | ICD-10-CM | POA: Diagnosis not present

## 2019-05-26 DIAGNOSIS — C221 Intrahepatic bile duct carcinoma: Secondary | ICD-10-CM | POA: Diagnosis present

## 2019-05-26 DIAGNOSIS — E7800 Pure hypercholesterolemia, unspecified: Secondary | ICD-10-CM | POA: Diagnosis present

## 2019-05-26 DIAGNOSIS — C799 Secondary malignant neoplasm of unspecified site: Secondary | ICD-10-CM | POA: Diagnosis present

## 2019-05-26 DIAGNOSIS — E43 Unspecified severe protein-calorie malnutrition: Secondary | ICD-10-CM | POA: Diagnosis present

## 2019-05-26 DIAGNOSIS — Z66 Do not resuscitate: Secondary | ICD-10-CM | POA: Diagnosis present

## 2019-05-26 DIAGNOSIS — D649 Anemia, unspecified: Secondary | ICD-10-CM | POA: Diagnosis not present

## 2019-05-26 DIAGNOSIS — K5900 Constipation, unspecified: Secondary | ICD-10-CM | POA: Diagnosis present

## 2019-05-26 DIAGNOSIS — Z806 Family history of leukemia: Secondary | ICD-10-CM | POA: Diagnosis not present

## 2019-05-26 DIAGNOSIS — Z8505 Personal history of malignant neoplasm of liver: Secondary | ICD-10-CM | POA: Diagnosis not present

## 2019-05-26 DIAGNOSIS — Z791 Long term (current) use of non-steroidal anti-inflammatories (NSAID): Secondary | ICD-10-CM | POA: Diagnosis not present

## 2019-05-26 DIAGNOSIS — E785 Hyperlipidemia, unspecified: Secondary | ICD-10-CM | POA: Diagnosis present

## 2019-05-26 DIAGNOSIS — R6520 Severe sepsis without septic shock: Secondary | ICD-10-CM | POA: Diagnosis not present

## 2019-05-26 DIAGNOSIS — K219 Gastro-esophageal reflux disease without esophagitis: Secondary | ICD-10-CM | POA: Diagnosis present

## 2019-05-26 DIAGNOSIS — T451X5A Adverse effect of antineoplastic and immunosuppressive drugs, initial encounter: Secondary | ICD-10-CM | POA: Diagnosis present

## 2019-05-26 DIAGNOSIS — E1140 Type 2 diabetes mellitus with diabetic neuropathy, unspecified: Secondary | ICD-10-CM | POA: Diagnosis present

## 2019-05-26 DIAGNOSIS — A419 Sepsis, unspecified organism: Secondary | ICD-10-CM | POA: Diagnosis not present

## 2019-05-26 DIAGNOSIS — Z800 Family history of malignant neoplasm of digestive organs: Secondary | ICD-10-CM | POA: Diagnosis not present

## 2019-05-26 DIAGNOSIS — Z515 Encounter for palliative care: Secondary | ICD-10-CM | POA: Diagnosis present

## 2019-05-26 DIAGNOSIS — D6481 Anemia due to antineoplastic chemotherapy: Secondary | ICD-10-CM | POA: Diagnosis present

## 2019-05-26 DIAGNOSIS — Z7984 Long term (current) use of oral hypoglycemic drugs: Secondary | ICD-10-CM | POA: Diagnosis not present

## 2019-05-26 DIAGNOSIS — D72823 Leukemoid reaction: Secondary | ICD-10-CM | POA: Diagnosis present

## 2019-05-26 DIAGNOSIS — R188 Other ascites: Secondary | ICD-10-CM | POA: Diagnosis present

## 2019-06-04 DIAGNOSIS — Z7984 Long term (current) use of oral hypoglycemic drugs: Secondary | ICD-10-CM | POA: Diagnosis not present

## 2019-06-04 DIAGNOSIS — T451X5A Adverse effect of antineoplastic and immunosuppressive drugs, initial encounter: Secondary | ICD-10-CM | POA: Diagnosis not present

## 2019-06-04 DIAGNOSIS — G629 Polyneuropathy, unspecified: Secondary | ICD-10-CM | POA: Diagnosis not present

## 2019-06-04 DIAGNOSIS — D6481 Anemia due to antineoplastic chemotherapy: Secondary | ICD-10-CM | POA: Diagnosis not present

## 2019-06-04 DIAGNOSIS — E7800 Pure hypercholesterolemia, unspecified: Secondary | ICD-10-CM | POA: Diagnosis not present

## 2019-06-04 DIAGNOSIS — R188 Other ascites: Secondary | ICD-10-CM | POA: Diagnosis not present

## 2019-06-04 DIAGNOSIS — C221 Intrahepatic bile duct carcinoma: Secondary | ICD-10-CM | POA: Diagnosis not present

## 2019-06-04 DIAGNOSIS — R180 Malignant ascites: Secondary | ICD-10-CM | POA: Diagnosis not present

## 2019-06-04 DIAGNOSIS — E119 Type 2 diabetes mellitus without complications: Secondary | ICD-10-CM | POA: Diagnosis not present

## 2019-06-04 DIAGNOSIS — Z5111 Encounter for antineoplastic chemotherapy: Secondary | ICD-10-CM | POA: Diagnosis not present

## 2019-06-12 DIAGNOSIS — D72823 Leukemoid reaction: Secondary | ICD-10-CM | POA: Diagnosis not present

## 2019-06-12 DIAGNOSIS — T451X5A Adverse effect of antineoplastic and immunosuppressive drugs, initial encounter: Secondary | ICD-10-CM | POA: Diagnosis not present

## 2019-06-12 DIAGNOSIS — D6481 Anemia due to antineoplastic chemotherapy: Secondary | ICD-10-CM | POA: Diagnosis not present

## 2019-06-12 DIAGNOSIS — R188 Other ascites: Secondary | ICD-10-CM | POA: Diagnosis not present

## 2019-06-12 DIAGNOSIS — C221 Intrahepatic bile duct carcinoma: Secondary | ICD-10-CM | POA: Diagnosis not present

## 2019-06-12 DIAGNOSIS — G629 Polyneuropathy, unspecified: Secondary | ICD-10-CM | POA: Diagnosis not present

## 2019-06-12 DIAGNOSIS — C787 Secondary malignant neoplasm of liver and intrahepatic bile duct: Secondary | ICD-10-CM | POA: Diagnosis not present

## 2019-06-12 DIAGNOSIS — Z7984 Long term (current) use of oral hypoglycemic drugs: Secondary | ICD-10-CM | POA: Diagnosis not present

## 2019-06-12 DIAGNOSIS — Z5111 Encounter for antineoplastic chemotherapy: Secondary | ICD-10-CM | POA: Diagnosis not present

## 2019-06-12 DIAGNOSIS — E7800 Pure hypercholesterolemia, unspecified: Secondary | ICD-10-CM | POA: Diagnosis not present

## 2019-06-12 DIAGNOSIS — E119 Type 2 diabetes mellitus without complications: Secondary | ICD-10-CM | POA: Diagnosis not present

## 2019-07-01 DEATH — deceased

## 2020-10-07 ENCOUNTER — Encounter: Payer: Self-pay | Admitting: *Deleted
